# Patient Record
Sex: Male | Born: 1955 | Race: White | Hispanic: No | Marital: Married | Smoking: Never smoker
Health system: Southern US, Community
[De-identification: ages and names within clinical notes are randomized; demographics above are authoritative.]

## PROBLEM LIST (undated history)

## (undated) DIAGNOSIS — F32A Depression, unspecified: Secondary | ICD-10-CM

## (undated) DIAGNOSIS — F329 Major depressive disorder, single episode, unspecified: Secondary | ICD-10-CM

## (undated) DIAGNOSIS — N189 Chronic kidney disease, unspecified: Secondary | ICD-10-CM

## (undated) DIAGNOSIS — K219 Gastro-esophageal reflux disease without esophagitis: Secondary | ICD-10-CM

## (undated) DIAGNOSIS — Z9889 Other specified postprocedural states: Secondary | ICD-10-CM

## (undated) DIAGNOSIS — I341 Nonrheumatic mitral (valve) prolapse: Secondary | ICD-10-CM

## (undated) DIAGNOSIS — R112 Nausea with vomiting, unspecified: Secondary | ICD-10-CM

## (undated) DIAGNOSIS — G43909 Migraine, unspecified, not intractable, without status migrainosus: Secondary | ICD-10-CM

## (undated) DIAGNOSIS — K589 Irritable bowel syndrome without diarrhea: Secondary | ICD-10-CM

## (undated) DIAGNOSIS — E785 Hyperlipidemia, unspecified: Secondary | ICD-10-CM

## (undated) HISTORY — DX: Major depressive disorder, single episode, unspecified: F32.9

## (undated) HISTORY — DX: Irritable bowel syndrome, unspecified: K58.9

## (undated) HISTORY — DX: Migraine, unspecified, not intractable, without status migrainosus: G43.909

## (undated) HISTORY — PX: VASECTOMY: SHX75

## (undated) HISTORY — PX: ANAL FISSURE REPAIR: SHX2312

## (undated) HISTORY — DX: Hyperlipidemia, unspecified: E78.5

## (undated) HISTORY — DX: Nonrheumatic mitral (valve) prolapse: I34.1

## (undated) HISTORY — DX: Depression, unspecified: F32.A

## (undated) HISTORY — PX: SHOULDER SURGERY: SHX246

---

## 1998-03-01 ENCOUNTER — Ambulatory Visit: Admission: RE | Admit: 1998-03-01 | Discharge: 1998-03-01 | Payer: Self-pay | Admitting: Family Medicine

## 1999-05-20 ENCOUNTER — Ambulatory Visit (HOSPITAL_COMMUNITY): Admission: RE | Admit: 1999-05-20 | Discharge: 1999-05-20 | Payer: Self-pay | Admitting: Gastroenterology

## 2002-06-19 ENCOUNTER — Encounter: Payer: Self-pay | Admitting: General Surgery

## 2002-06-21 ENCOUNTER — Ambulatory Visit (HOSPITAL_COMMUNITY): Admission: RE | Admit: 2002-06-21 | Discharge: 2002-06-21 | Payer: Self-pay | Admitting: General Surgery

## 2002-07-14 ENCOUNTER — Emergency Department (HOSPITAL_COMMUNITY): Admission: EM | Admit: 2002-07-14 | Discharge: 2002-07-14 | Payer: Self-pay | Admitting: Emergency Medicine

## 2002-07-20 HISTORY — PX: OTHER SURGICAL HISTORY: SHX169

## 2004-07-20 HISTORY — PX: MITRAL VALVE REPAIR: SHX2039

## 2005-03-04 ENCOUNTER — Ambulatory Visit: Payer: Self-pay | Admitting: Cardiology

## 2005-03-18 ENCOUNTER — Ambulatory Visit: Payer: Self-pay

## 2005-03-27 ENCOUNTER — Ambulatory Visit: Payer: Self-pay

## 2005-03-31 ENCOUNTER — Ambulatory Visit: Payer: Self-pay | Admitting: Cardiology

## 2005-04-01 ENCOUNTER — Ambulatory Visit (HOSPITAL_COMMUNITY): Admission: RE | Admit: 2005-04-01 | Discharge: 2005-04-01 | Payer: Self-pay | Admitting: Cardiology

## 2005-04-01 ENCOUNTER — Encounter: Payer: Self-pay | Admitting: Cardiology

## 2005-04-03 ENCOUNTER — Inpatient Hospital Stay (HOSPITAL_BASED_OUTPATIENT_CLINIC_OR_DEPARTMENT_OTHER): Admission: RE | Admit: 2005-04-03 | Discharge: 2005-04-03 | Payer: Self-pay | Admitting: Internal Medicine

## 2005-04-13 ENCOUNTER — Ambulatory Visit
Admission: RE | Admit: 2005-04-13 | Discharge: 2005-04-13 | Payer: Self-pay | Admitting: Thoracic Surgery (Cardiothoracic Vascular Surgery)

## 2005-04-22 ENCOUNTER — Inpatient Hospital Stay (HOSPITAL_COMMUNITY)
Admission: RE | Admit: 2005-04-22 | Discharge: 2005-04-27 | Payer: Self-pay | Admitting: Thoracic Surgery (Cardiothoracic Vascular Surgery)

## 2005-04-22 ENCOUNTER — Encounter (INDEPENDENT_AMBULATORY_CARE_PROVIDER_SITE_OTHER): Payer: Self-pay | Admitting: Specialist

## 2005-04-30 ENCOUNTER — Ambulatory Visit: Payer: Self-pay | Admitting: Cardiology

## 2005-05-01 ENCOUNTER — Emergency Department (HOSPITAL_COMMUNITY): Admission: EM | Admit: 2005-05-01 | Discharge: 2005-05-01 | Payer: Self-pay | Admitting: Emergency Medicine

## 2005-05-07 ENCOUNTER — Ambulatory Visit: Payer: Self-pay | Admitting: Cardiology

## 2005-05-11 ENCOUNTER — Encounter
Admission: RE | Admit: 2005-05-11 | Discharge: 2005-05-11 | Payer: Self-pay | Admitting: Thoracic Surgery (Cardiothoracic Vascular Surgery)

## 2005-05-14 ENCOUNTER — Ambulatory Visit: Payer: Self-pay | Admitting: Cardiology

## 2005-05-21 ENCOUNTER — Ambulatory Visit: Payer: Self-pay | Admitting: Cardiology

## 2005-05-27 ENCOUNTER — Ambulatory Visit: Payer: Self-pay | Admitting: Cardiology

## 2005-06-01 ENCOUNTER — Ambulatory Visit: Payer: Self-pay | Admitting: Cardiology

## 2005-06-25 ENCOUNTER — Ambulatory Visit: Payer: Self-pay | Admitting: Internal Medicine

## 2005-07-06 ENCOUNTER — Ambulatory Visit: Payer: Self-pay | Admitting: Cardiology

## 2005-07-09 ENCOUNTER — Ambulatory Visit: Payer: Self-pay | Admitting: Cardiology

## 2005-12-21 ENCOUNTER — Ambulatory Visit: Payer: Self-pay | Admitting: Cardiology

## 2006-07-08 IMAGING — CR DG CHEST 2V
2 series · 2 of 2 positions shown · non-contrast
Comparison: Chest 04/25/05.

CLINICAL DATA: Right-sided chest pain.  Previous mitral valve repair 22 April, 2005.  
 CHEST - 2 VIEW:

[view not recorded (1 of 2)]
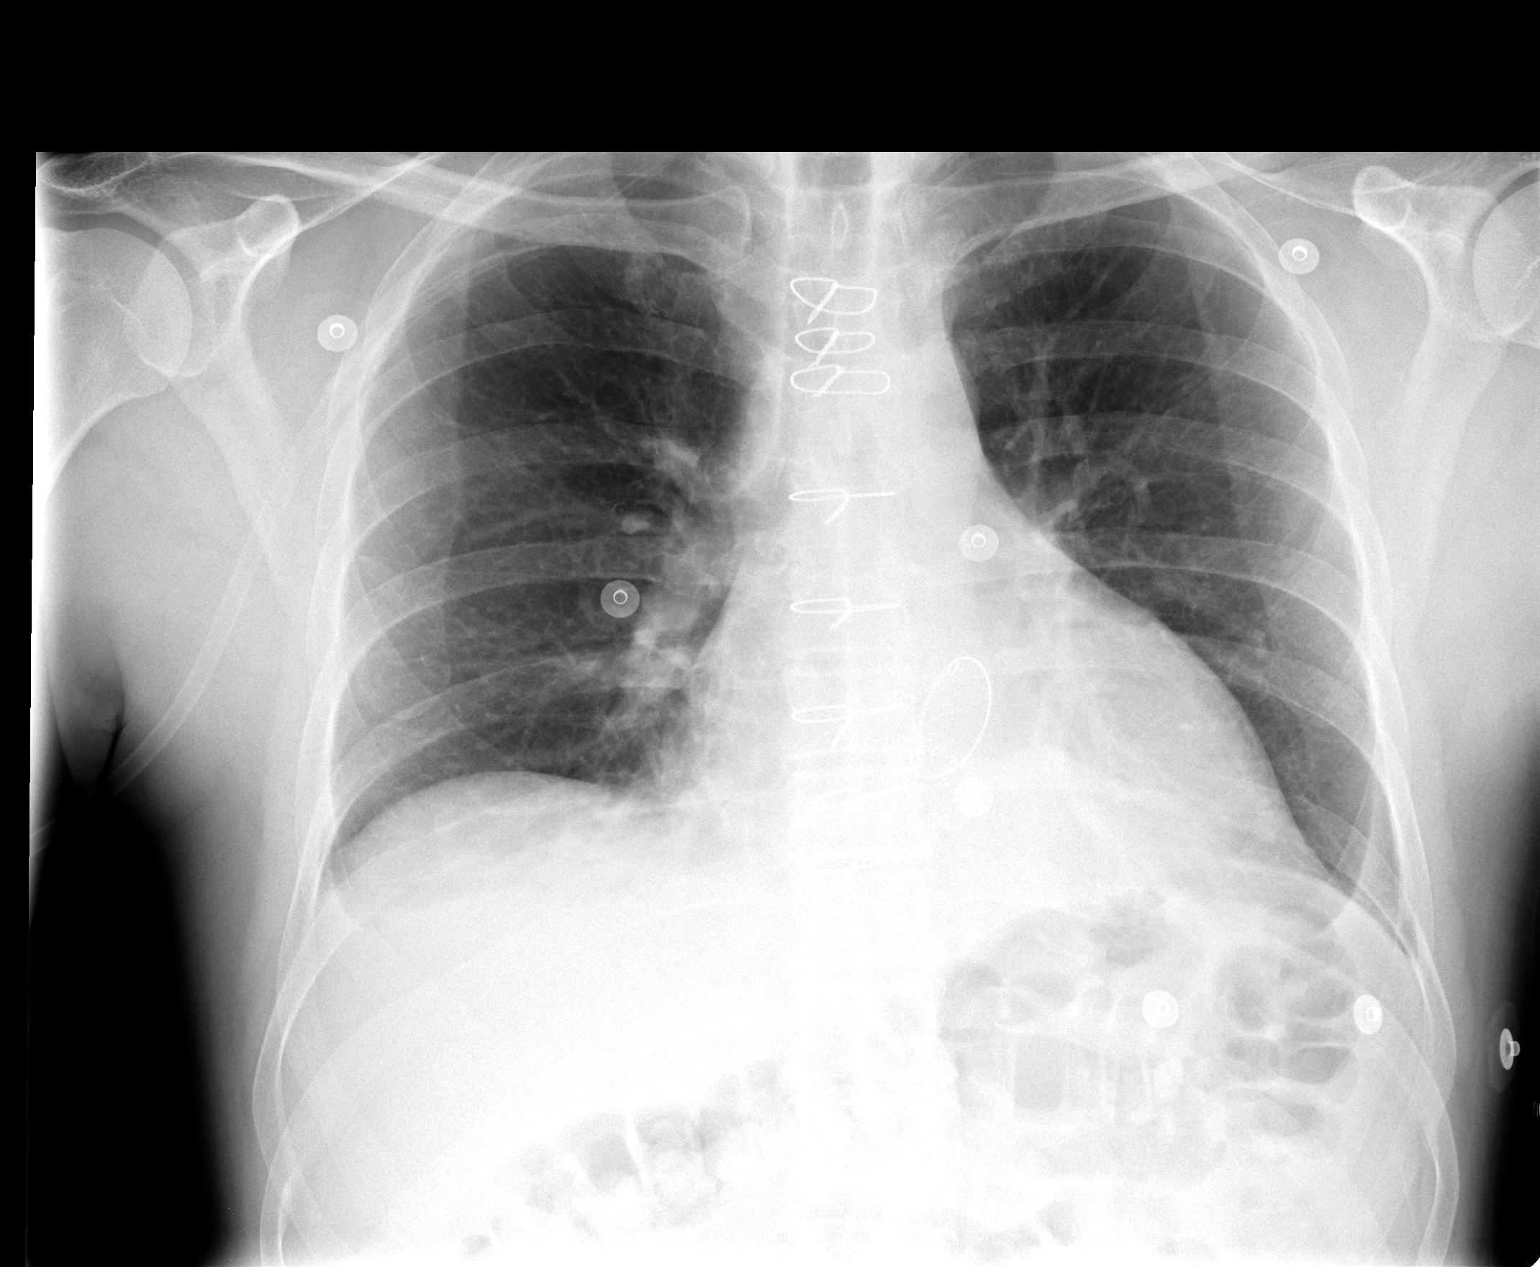

[view not recorded (2 of 2)]
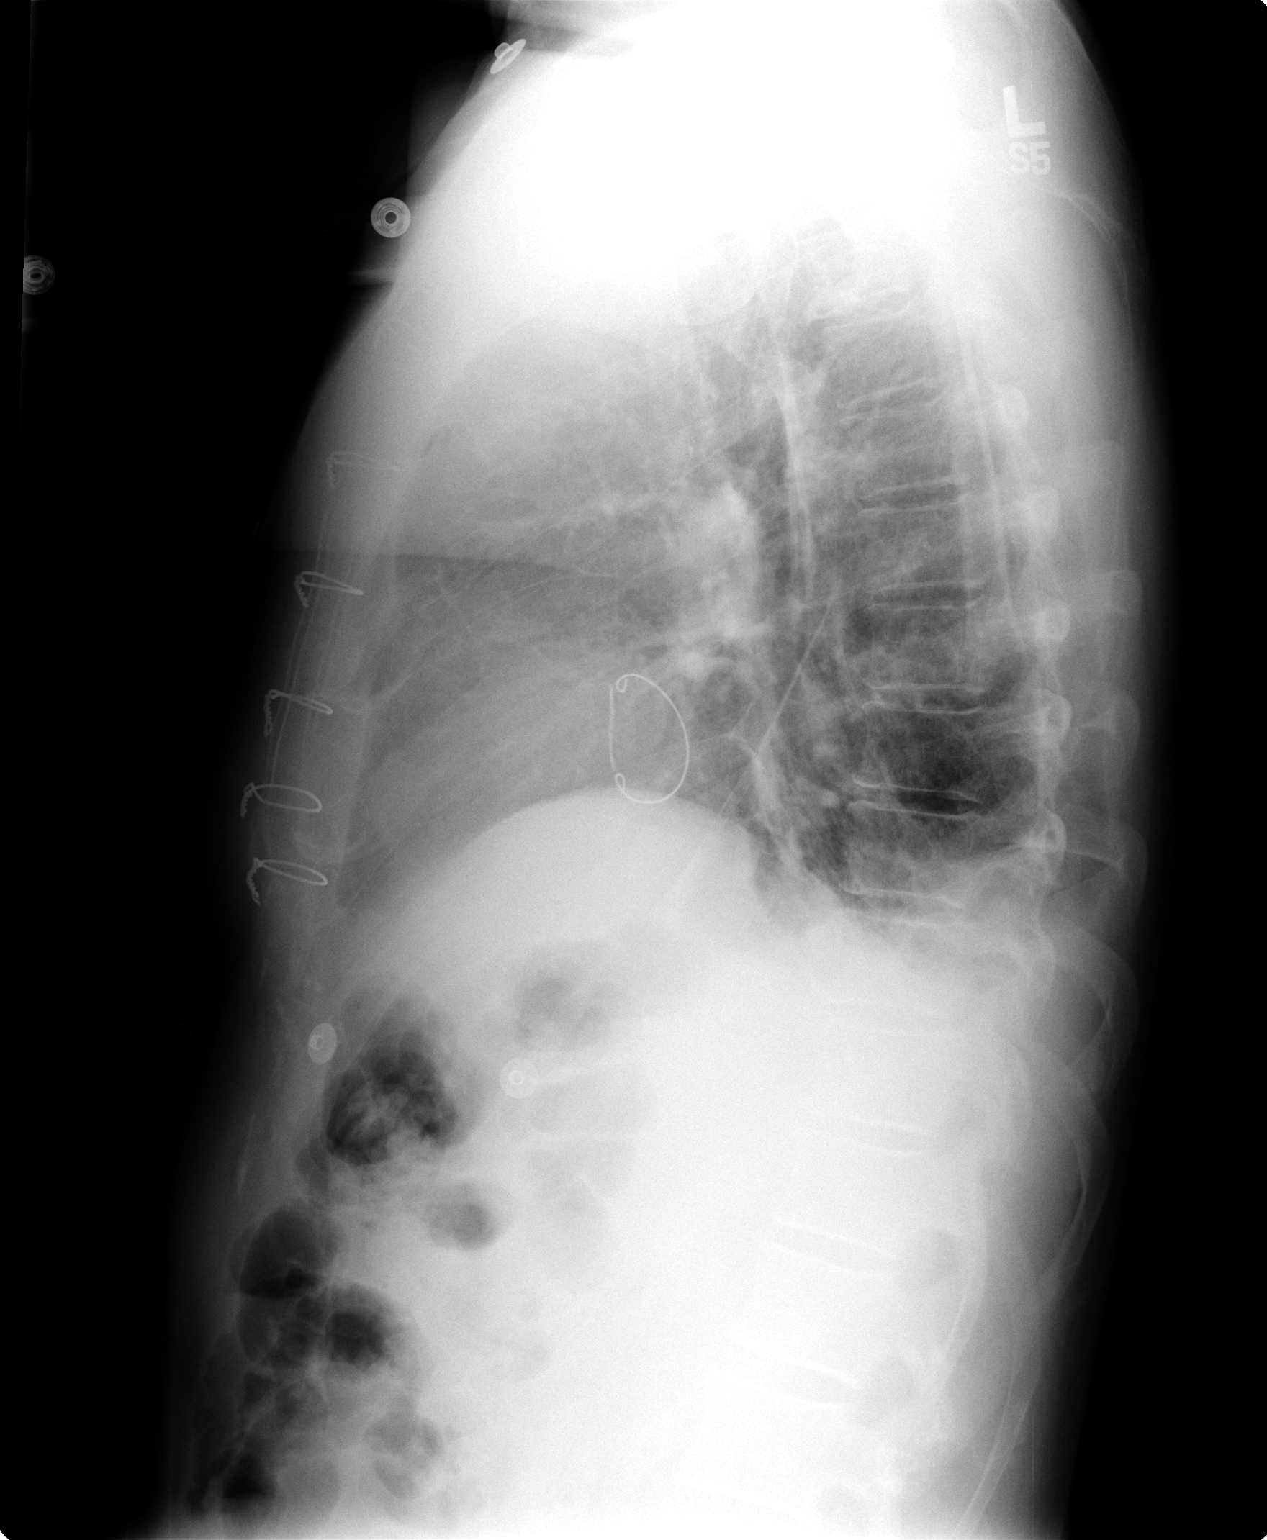

[2 of 2 positions shown; findings below may reference images not displayed]

FINDINGS: Borderline cardiomegaly.  Mitral valve prosthesis in apparent good position.  Mild atelectatic change in the retrocardiac region of the left lower lobe.  Small bilateral pleural effusions are present.  There are no infiltrates or failure.  I see no pneumothorax.  Overall there is marked improvement in aeration compared to the prior study.
IMPRESSION: Mild subsegmental atelectasis at the left base and small bilateral effusions.  Otherwise markedly improved aeration.

## 2006-07-18 IMAGING — CR DG CHEST 2V
2 series · 2 of 2 positions shown · non-contrast
Comparison: Two view chest x-ray 05/01/2005.

CLINICAL DATA: Mitral valve replacement 04/22/2005.

CHEST - 2 VIEW  05/11/2005:

[view not recorded (1 of 2)]
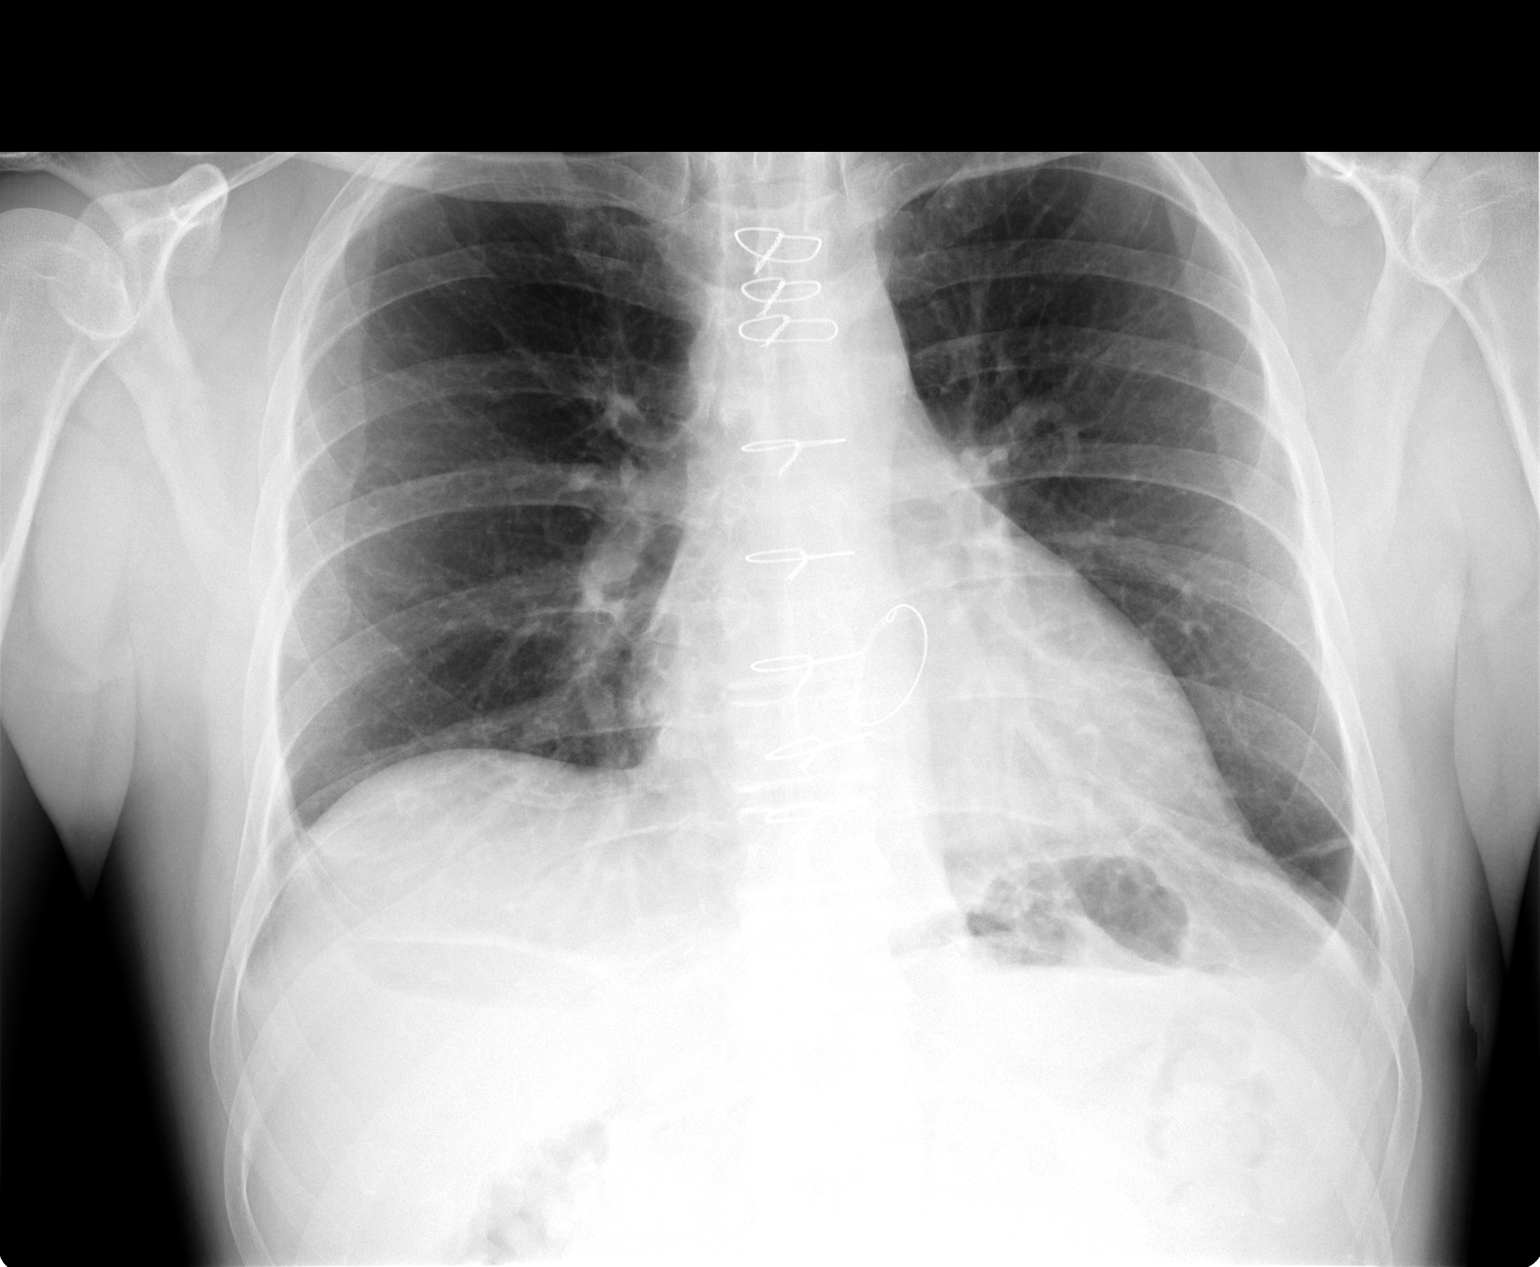

[view not recorded (2 of 2)]
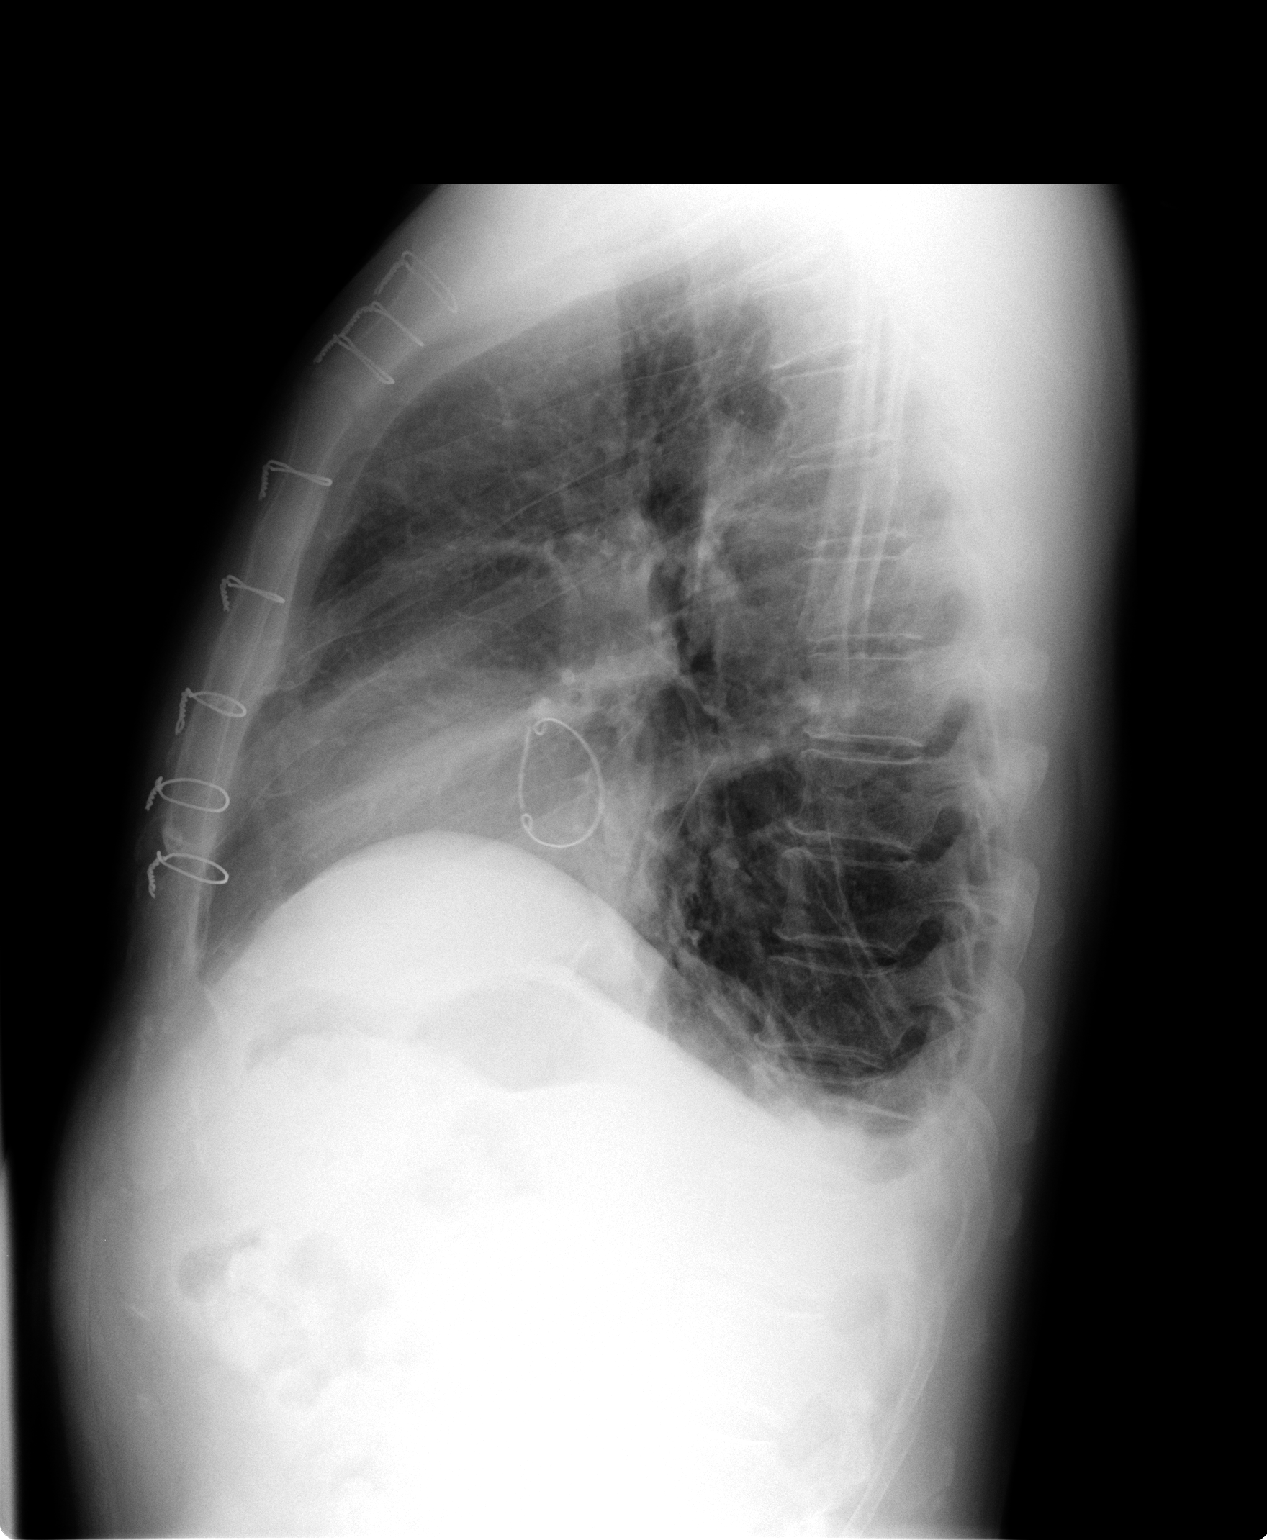

[2 of 2 positions shown; findings below may reference images not displayed]

FINDINGS: The heart is mildly enlarged but stable. The patient has undergone
mitral valve replacement. The bilateral pleural effusions have improved since
the previous examination, but do persist. There is associated mild atelectasis
in the lower lobes. The lungs are otherwise clear and there is no evidence of
pulmonary edema. Moderate degenerative changes are again noted in the thoracic
spine.
IMPRESSION: Improved but persistent small bilateral pleural effusions and associated
atelectasis in the lower lobes. No new abnormalities.

## 2006-07-20 HISTORY — PX: KNEE SURGERY: SHX244

## 2006-12-23 ENCOUNTER — Ambulatory Visit: Payer: Self-pay | Admitting: Cardiology

## 2007-05-19 ENCOUNTER — Encounter: Admission: RE | Admit: 2007-05-19 | Discharge: 2007-08-17 | Payer: Self-pay | Admitting: Orthopedic Surgery

## 2008-07-20 HISTORY — PX: INGUINAL HERNIA REPAIR: SUR1180

## 2008-09-26 ENCOUNTER — Ambulatory Visit: Payer: Self-pay | Admitting: Cardiology

## 2008-10-03 ENCOUNTER — Encounter: Payer: Self-pay | Admitting: Cardiology

## 2008-10-03 ENCOUNTER — Ambulatory Visit: Payer: Self-pay

## 2009-02-04 ENCOUNTER — Telehealth: Payer: Self-pay | Admitting: Cardiology

## 2009-07-26 ENCOUNTER — Encounter (INDEPENDENT_AMBULATORY_CARE_PROVIDER_SITE_OTHER): Payer: Self-pay | Admitting: *Deleted

## 2010-01-31 DIAGNOSIS — F329 Major depressive disorder, single episode, unspecified: Secondary | ICD-10-CM

## 2010-01-31 DIAGNOSIS — F3289 Other specified depressive episodes: Secondary | ICD-10-CM | POA: Insufficient documentation

## 2010-01-31 DIAGNOSIS — I059 Rheumatic mitral valve disease, unspecified: Secondary | ICD-10-CM | POA: Insufficient documentation

## 2010-02-04 ENCOUNTER — Ambulatory Visit: Payer: Self-pay | Admitting: Cardiology

## 2010-02-04 DIAGNOSIS — E785 Hyperlipidemia, unspecified: Secondary | ICD-10-CM | POA: Insufficient documentation

## 2010-08-19 NOTE — Letter (Signed)
Summary: Appointment - Reminder 2  [employer], Main Office  [address]   [place], [place] [address]   Phone: [phone]  Fax: [phone]     July 26, 2009 MRN: [id number]   [facility] [address] [place], [place]  [address]   Dear Craig Duke,  Our records indicate that it is time to schedule a follow-up appointment.Dr.Hochrein recommended that you follow up with [place] in March,2011. It is very important that we reach you to schedule this appointment. We look forward to participating in your health care needs. Please contact us at the number listed above at your earliest convenience to schedule your appointment.  If you are unable to make an appointment at this time, give [place] a call so we can update our records.     Sincerely,   Glass blower/designer

## 2010-08-19 NOTE — Assessment & Plan Note (Signed)
Summary: f1y/jml   Visit Type:  Follow-up Primary Provider:  [facility]  CC:  Mitral Valve Repair.  History of Present Illness: The patient returns for yearly followup. Since I last saw him he has done quite well. He is in exercises routinely as I would like though he did 6 mile walk the other day without any problems. He has had no shortness of breath, PND or orthopnea. He has had no chest pressure, neck or arm discomfort. He has had no palpitations, presyncope or syncope. He has had no weight gain or edema.  Current Medications (verified): 1)  Crestor 20 Mg Tabs (Rosuvastatin Calcium) .... Take One Tablet By Mouth Daily. 2)  Aspirin 81 Mg Tbec (Aspirin) .... Take One Tablet By Mouth Daily 3)  Citalopram Hydrobromide 10 Mg Tabs (Citalopram Hydrobromide) .... Take 1 Tablet By Mouth Once A Day  Allergies (verified): 1)  ! Penicillin 2)  ! * Oxycodone 3)  ! Hydrocodone  Past History:  Past Medical History: Reviewed history from 01/31/2010 and no changes required. Mitral valve repair secondary to prolapse with   severe regurgitation in April 22, 2005, by Dr. Cornelius Moras; dyslipidemia; ST   depression; anal sphincterectomy for anal fissure repair; vasectomy;   history of migraines; irritable bowel syndrome.      Past Surgical History:  1.  Anal sphincterotomy for anal fissure.  2.  Vasectomy  3.  Bilateral inguinal hernia repair  Review of Systems       As stated in the HPI and negative for all other systems.   Vital Signs:  Patient profile:   55 year old male Height:      67 inches Weight:      176.25 pounds BMI:     27.70 Pulse rate:   69 / minute Pulse rhythm:   regular Resp:     18 per minute BP sitting:   120 / 74  (right arm) Cuff size:   large  Vitals Entered By: Vikki Ports (February 04, 2010 9:49 AM)  Physical Exam  General:  Well developed, well nourished, in no acute distress. Head:  normocephalic and atraumatic Eyes:  PERRLA/EOM intact;  conjunctiva and lids normal. Mouth:  Teeth, gums and palate normal. Oral mucosa normal. Neck:  Neck supple, no JVD. No masses, thyromegaly or abnormal cervical nodes. Chest Wall:  well healed surgical scar Lungs:  Clear bilaterally to auscultation and percussion. Abdomen:  Bowel sounds positive; abdomen soft and non-tender without masses, organomegaly, or hernias noted. No hepatosplenomegaly. Msk:  Back normal, normal gait. Muscle strength and tone normal. Extremities:  No clubbing or cyanosis. Neurologic:  Alert and oriented x 3. Skin:  Intact without lesions or rashes. Cervical Nodes:  no significant adenopathy Inguinal Nodes:  no significant adenopathy Psych:  Normal affect.   Detailed Cardiovascular Exam  Neck    Carotids: Carotids full and equal bilaterally without bruits.      Neck Veins: Normal, no JVD.    Heart    Inspection: no deformities or lifts noted.      Palpation: normal PMI with no thrills palpable.      Auscultation: regular rate and rhythm, S1, S2 without murmurs, rubs, gallops, or clicks.    Vascular    Abdominal Aorta: no palpable masses, pulsations, or audible bruits.      Femoral Pulses: normal femoral pulses bilaterally.      Pedal Pulses: normal pedal pulses bilaterally.      Radial Pulses: normal radial pulses bilaterally.  Peripheral Circulation: no clubbing, cyanosis, or edema noted with normal capillary refill.     EKG  Procedure date:  02/04/2010  Findings:      Sinus rhythm, rate 69, leftward axis, no acute ST-T wave changes  Impression & Recommendations:  Problem # 1:  Mitral Valve Repair The patient has absolutely no murmur and no symptoms. I suspect a very stable repair. No imaging is indicated this year. I will bring him back in one year for followup and do an echo at that time as it will have been 6 years since the procedure and greater than 2 years since the prior echo.  Problem # 2:  DYSLIPIDEMIA (ICD-272.4) He says this is  followed by his primary provider. He reports an excellent lipid profile cholesterol. I will defer management to his primary team.  Other Orders: EKG w/ Interpretation (93000)

## 2010-12-02 NOTE — Assessment & Plan Note (Signed)
[facility]                            CARDIOLOGY OFFICE NOTE   Craig Duke, Craig Duke                       MRN:          [id number]  DATE:12/23/2006                            DOB:          1956/05/11    REASON FOR PRESENTATION:  Evaluate patient with mitral regurgitation,  status post repair.   HISTORY OF PRESENT ILLNESS:  Patient returns for 1-year followup.  He  has done quite well since I last saw him.  He is 55 years old.  He  denies any shortness of breath.  He is very active hunting and working.  He denies any PND, orthopnea, or chest discomfort.  He has had no  palpitations, presyncope, or syncope.   PAST MEDICAL HISTORY:  1. Mitral valve repair secondary to prolapse with severe regurgitation      April 22, 2005.  2. Dyslipidemia.  3. History of depression.  4. Anal sphincterotomy for anal fissure repair.  5. Vasectomy.  6. History of migraines.  7. Irritable bowel syndrome.   ALLERGIES:  PENICILLIN.   MEDICATIONS:  1. Aspirin 81 mg daily.  2. Lexapro 10 mg daily.   REVIEW OF SYSTEMS:  As stated in the HPI, negative for other systems.   PHYSICAL EXAMINATION:  Patient is in no distress.  Blood pressure 115/58.  Heart rate 81 and regular.  Weight 178 pounds.  Body mass index 27.  NECK:  No jugular venous distention at 45 degrees.  Carotid upstroke  brisk and symmetric.  No bruits.  No thyromegaly.  LYMPHATICS:  No lymphadenopathy.  LUNGS:  Clear to auscultation bilaterally.  BACK:  No costovertebral angle tenderness.  CHEST:  Unremarkable.  HEART:  PMI not displaced or sustained.  S1 and S2 within normal limits.  No S3.  No S4.  No clicks.  No rubs.  No murmurs.  ABDOMEN:  Flat.  Positive bowel sounds.  Normal in frequency and pitch.  No bruits.  No rebound.  No guarding.  No midline pulsatile mass.  No  hepatomegaly.  No splenomegaly.  SKIN:  No rashes.  No nodules.  EXTREMITIES:  Two plus pulses.  No edema.   EKG:  Sinus  rhythm, rate 81, axis within normal limits, intervals within  normal limits, no acute ST wave changes.   ASSESSMENT AND PLAN:  1. Mitral regurgitation.  The patient had excellent repair by Dr.      Cornelius Moras.  There is no obvious regurgitant residual murmur.  He is      having no symptoms.  He will be followed clinically.  We discussed      the latest endocarditis prophylaxis guidelines.  2. Followup.  We will him again in 18 months, or sooner if needed.     Rollene Rotunda, MD, [facility]  Electronically Signed    JH/MedQ  DD: 12/23/2006  DT: 12/23/2006  Job #: [id number]   cc:   [facility] [place]

## 2010-12-02 NOTE — Assessment & Plan Note (Signed)
[facility]                            CARDIOLOGY OFFICE NOTE   Craig, Duke                       MRN:          [id number]  DATE:09/26/2008                            DOB:          [birth year]    REASON FOR PRESENTATION:  Evaluate the patient with mitral regurgitation  status post valve repair.   HISTORY OF PRESENT ILLNESS:  The patient returns for followup.  It has  been about 20 months since I last saw him.  He has done well from a  cardiovascular standpoint.  He has had no cardiovascular symptoms.  He  denies any chest pain, neck, or arm discomfort.  He has had no  palpitations, presyncope, or syncope.  He has had no shortness of  breath.  He denies any PND or orthopnea.  He does not exercise, but he  works vigorously.  He has had some mild orthostatic symptoms  occasionally.   PAST MEDICAL HISTORY:  Mitral valve repair secondary to prolapse with  severe regurgitation in April 22, 2005, by Dr. Cornelius Moras; dyslipidemia; ST  depression; anal sphincterectomy for anal fissure repair; vasectomy;  history of migraines; irritable bowel syndrome.   ALLERGIES:  None.   MEDICATONS:  1. Aspirin 81 mg daily.  2. Citalopram 10 mg daily.  3. Crestor 20 mg daily.   REVIEW OF SYSTEMS:  Stated in the HPI and otherwise negative for all  other system.   PHYSICAL EXAMINATION:  GENERAL:  The patient is pleasant, in no  distress.  VITAL SIGNS:  Blood pressure is 107/68, heart rate 77 and regular.  No  orthostatic blood pressure changes.  HEENT:  Eyes were unremarkable.  Pupils equal, round, and reactive to  light; fundi not visualized, oral mucosa unremarkable.  NECK:  No jugular venous distension at 45 degrees, carotid upstroke  brisk and symmetrical, no bruits, no thyromegaly.  LYMPHATICS:  No cervical, axillary, or inguinal adenopathy.  LUNGS:  Clear to auscultation bilaterally.  BACK:  No costovertebral angle tenderness.  CHEST:  Unremarkable.  HEART:  PMI not displaced or sustained.  S1 and S2 within normal limits.  No S3, no S4.  No clicks, no rubs, no murmurs.  ABDOMEN:  Flat, positive bowel sounds, normal in frequency and pitch.  No bruits, no rebound, no guarding.  No midline pulsatile mass.  No  hepatomegaly, no splenomegaly.  SKIN:  No rashes, no nodules.  EXTREMITIES:  2+ pulses throughout, no edema.  No cyanosis, no clubbing.  NEURO:  Oriented to person, place, and time.  Cranial nerves II through  XII grossly intact.  Motor grossly intact.   EKG:  Sinus rhythm, rate 62, axis within normal limits, intervals within  normal limits, no acute ST-T wave changes.   ASSESSMENT AND PLAN:  1. Mitral valve regurgitation with repair.  This should be now 4 years      since his valve repair.  He has not had a followup echocardiogram.      One is due.  I will arrange this.  He understands endocarditis      prophylaxis.  Provided the echo looks  fine, no further      cardiovascular testing will be planned, but I will follow him      yearly with physical exams.  2. Orthostatic hypertension.  The patient describes the mild symptoms.      We discussed avoidance measures.  He sometimes does not drink much      fluid and he starts out with low blood pressure.  He knows to avoid      standing quickly and to stay hydrated.  3. Dyslipidemia.  Per his primary care doctor.  He was recently      switched to Crestor.  4. Followup.  I will see him back in 1 year or sooner if needed.     Rollene Rotunda, MD, [facility]  Electronically Signed    JH/MedQ  DD: 09/26/2008  DT: 09/27/2008  Job #: [id number]

## 2010-12-05 NOTE — Discharge Summary (Signed)
NAME:  Craig Duke, Craig Duke                ACCOUNT NO.:  [id number]   MEDICAL RECORD NO.:  [id number]          PATIENT TYPE:  INP   LOCATION:  2022                         FACILITY:  [facility]   PHYSICIAN:  Salvatore Decent. Cornelius Moras, M.D. DATE OF BIRTH:  [birth year]   DATE OF ADMISSION:  04/22/2005  DATE OF DISCHARGE:                                 DISCHARGE SUMMARY   DATE OF DISCHARGE:  April 27, 2005   ADMISSION DIAGNOSIS:  Mitral valve prolapse with severe mitral  regurgitation.   DISCHARGE DIAGNOSES:  1.  Mitral valve prolapse with severe mitral regurgitation, status post      repair.  2.  Hyperlipidemia.  3.  Depression.  4.  Anal sphincterotomy for anal fissure.  5.  Vasectomy.  6.  Allergic reaction to PENICILLIN as a child.  7.  History of migraine headaches.  8.  Remote history of reflux symptoms and irritable bowel syndrome.  9.  History of prostatitis approximately 10 years ago.   BRIEF HISTORY:  Craig Duke is a 55 year old Caucasian male with known history  of mitral valve prolapse with mitral regurgitation who was otherwise  healthy. He reported approximately 10 years ago he was noted to have a heart  murmur and underwent an echocardiogram in [place]. He has been taking  endocarditis prophylaxis over the years but remained otherwise without  symptoms. Recently, he was evaluated at [facility]  and was noted to have a prominent murmur on exam. He also had associated  exertional fatigue and shortness of breath that had seemed to progress over  the last years. He was referred to Dr. Rollene Rotunda who evaluated him on  March 04, 2005. Follow-up 2-D echocardiogram was performed on March 18, 2005 demonstrating mitral valve prolapse and severe mitral regurgitation.  Left ventricle was noted to be mildly dilated with an ejection fraction  estimated at 50-55%. 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 Left ventricular  function remained preserved and no other significant abnormalities were  appreciated.   He underwent elective left and right heart catheterization on April 03, 2005 which demonstrated a normal coronary artery anatomy with insignificant  coronary artery disease. There was mildly reduced left ventricular function  with an ejection fraction estimated at 45-50%. There was severe mitral  regurgitation with normal pulmonary artery pressures and normal cardiac  output. He was referred to heart surgeon, Dr. Salvatore Decent. Cornelius Moras, for elective  mitral valve repair.   After examining the patient and review the previous workup and findings, Dr.  Cornelius Moras did feel that mitral valve repair was the best treatment option.  Surgery was tentatively scheduled for April 15, 2005. In the meantime,  he was given a prescription for amiodarone 200 mg twice a day in an effort  to decrease the likelihood of perioperative arrhythmias.   PROCEDURE:  1.  Wednesday, April 22, 2005:  Mitral valve repair (quadrangular resection  of posterior leaflet, annuloplasty of the posterior commissure, 32      [employer] CG future ring annuloplasty) by Dr. Cornelius Moras April 22, 2005.  2.  April 22, 2005:  Intraoperative transesophageal echocardiogram by Dr.      Burna Forts. See dictated report.   HOSPITAL COURSE:  Alternatively, Craig Duke's surgery was rescheduled for  April 22, 2005 and he was taken to the operating room for mitral valve  repairs as we discussed. Craig Duke's postoperative course was rather  uneventful other than junctional rhythm in the immediate postoperative phase  requiring AAI pacing. On postoperative day three, his heart rate was  overriding the external pacer and the load stable at 70 to 80 beats per  minute. He was  maintained on amiodarone 200 mg twice daily postoperatively.  He was weaned off of the dopamine and Neo-Synephrine drips by postoperative  day one and was transferred out of the surgical intensive care unit. He  required short-term diuretic therapy for mild postoperative fluid volume  excess. There was no significant chest tube output postoperatively and chest  tubes were also removed by postoperative day one with follow-up chest x-ray  that was showing a small residual right apical pneumothorax and bibasilar  atelectasis.   Once out of the unit, Craig Duke made good progression. He was tolerating an  oral diet. He was voiding without difficulty. He did have some postoperative  constipation which was treated with stool softeners and laxatives. He was  able to mobilize in the hallways without great difficulty. He remained  hemodynamically stable with a systolic blood pressure around 100 and  diastolic around 60. He was afebrile. Again, he was maintaining sinus rhythm  without the use of external pacer by postoperative day three. He was  saturating above 92% on room air. His pain was controlled on oral  medications.   On exam, his heart had a regular rate and rhythm without murmur. Lungs were  clear. Abdominal exam was benign. Extremities were without edema and his  incisions were healing well without signs of infection.   Postoperative labs were also stable showing a sodium of 137, potassium 4.3,  chloride 101, CO2 30, blood glucose 100, BUN 14, creatinine 1.1, calcium  8.6. White blood count 9.4, hemoglobin 9.4, hematocrit 27.5, platelet count  133,000 which was increasing. Magnesium was normal at 2.4. At the time of  this dictation, his INR is still therapeutic at 1.2. It is anticipated that  he will need anticoagulation therapy for approximately three months  postoperatively for his mitral valve repair. Once his INR is therapeutic or near therapeutic, it is felt he will be appropriate  for discharge home,  hopefully by April 27, 2005 or April 28, 2005.   DISCHARGE MEDICATIONS:  1.  Aspirin 81 mg p.o. daily.  2.  Lipitor 10 mg p.o. daily.  3.  Amiodarone 200 mg p.o. twice daily.  4.  Lexapro 10 mg p.o. daily.  5.  Coumadin, final dose to be determined at the time of discharge.  6.  Ultram 50 mg 1-2 tablets p.o. q.4-6h. p.r.n. pain.   DISCHARGE INSTRUCTIONS:  He is instructed to avoid driving or heavy lifting  more than 10 pounds. He is to continue daily walking and breathing  exercises. He is to follow a low fat, low salt diet. He may shower and clean  his incisions gently with mild soap and water. He should notify the [facility]  office if he develops fever greater than 101, redness or  drainage from his  incision sites.   FOLLOW UP:  He is to have a PT/INR blood work within 48-72 hours at  discharge. He will be instructed on specific appointment at the time of  discharge. Coumadin therapy will be followed by Dr. Rollene Rotunda at  [facility]. He is to call and schedule a two week  follow-up with Dr. Rollene Rotunda with a chest x-ray. He is to follow up  with Dr. Salvatore Decent. Owen at the [facility] office in approximately three weeks.  He will be contacted with the specific appointment date and time and should  call sooner if needed.      Jerold Coombe, P.A.      Salvatore Decent. Cornelius Moras, M.D.  Electronically Signed    AWZ/MEDQ  D:  04/26/2005  T:  04/27/2005  Job:  [id number]   cc:   Salvatore Decent. Cornelius Moras, M.D.  [address]  [place]  [place] [address]   Ernestina Penna, M.D.  Fax: [phone]   Rollene Rotunda, M.D.  [phone]  [address]  [place]  [place] [address]

## 2010-12-05 NOTE — Op Note (Signed)
   NAME:  Craig Duke, Craig Duke                          ACCOUNT NO.:  [id number]   MEDICAL RECORD NO.:  [id number]                   PATIENT TYPE:  AMB   LOCATION:  DAY                                  FACILITY[facility]  [facility]   PHYSICIAN:  Ollen Gross. Vernell Morgans, M.D.              DATE OF BIRTH:  05/06/1956   DATE OF PROCEDURE:  06/21/2002  DATE OF DISCHARGE:  06/21/2002                                 OPERATIVE REPORT   PREOPERATIVE DIAGNOSES:  Anal fissure.   POSTOPERATIVE DIAGNOSES:  Anal fissure.   PROCEDURE:  Rigid sigmoidoscopy and examination under anesthesia and a left  lateral internal sphincterotomy.   SURGEON:  Ollen Gross. Carolynne Edouard, M.D.   ANESTHESIA:  General via LMA.   DESCRIPTION OF PROCEDURE:  After informed consent was obtained, the patient  was brought to the operating room, placed in a supine position in the  operating table. After adequate induction of general anesthesia, the patient  was placed in lithotomy position. The patient's perineum was then prepped  with Betadine and draped in the usual sterile manner. Prior to prepping and  draping, a rigid sigmoidoscopy was performed to about 20 cm and no obvious  abnormalities were identified except for the fissure. Once the patient was  prepped and draped, a bullet type retractor was placed within the rectum  gently so as not to create any other tears and the rectum was examined  circumferentially. A small posterior tear was identified and this area was  fulgurated with the electrocautery. Next in the left lateral position, a  small incision was made and the internal sphincter muscles were able to be  palpated. They were visually identified and hooked with a hemostat and  divided sharply with a 15 blade knife. The incision overlying the sphincter  muscles were then closed with a figure-of-eight 2-0 Chromic stitch. The  rectum was examined circumferentially and found to be hemostatic. The  perirectal area and sphincters were then injected  with a 0.25% Marcaine with  epinephrine. The patient tolerated the procedure well. At the end of the  case, all sponge, needle and instrument counts were correct. Lidocaine jelly  and Gelfoam were then placed in the rectum and sterile dressings were  applied. The patient was then awakened and taken to the recovery room in  stable condition.                                               Ollen Gross. Vernell Morgans, M.D.    PST/MEDQ  D:  07/06/2002  T:  07/06/2002  Job:  [id number]

## 2010-12-05 NOTE — Cardiovascular Report (Signed)
NAME:  DARROLD, BEZEK                ACCOUNT NO.:  [id number]   MEDICAL RECORD NO.:  [id number]          PATIENT TYPE:  OIB   LOCATION:  1962                         FACILITY:  [facility]   PHYSICIAN:  Arvilla Meres, M.D. LHCDATE OF BIRTH:  August 23, 1955   DATE OF PROCEDURE:  04/03/2005  DATE OF DISCHARGE:                              CARDIAC CATHETERIZATION   PRIMARY CARE PHYSICIAN:  Montey Hora, PA, at [facility].   CARDIOLOGIST:  Dr. Angelina Sheriff.   PATIENT IDENTIFICATION:  [name] is a very pleasant 55 year old male with  mitral valve prolapse and severe mitral regurgitation who is referred for  right and left heart catheterization prior to possible mitral valve repair.   PROCEDURES PERFORMED:  1.  Selective coronary angiography.  2.  Left heart catheterization.  3.  Left ventriculogram.  4.  Right heart catheterization with Fick cardiac outputs.   DESCRIPTION OF PROCEDURE:  The risks and benefits of the procedure were  explained to Mr. Althouse; consent was signed and placed in the chart.  A 7-  French venous sheath was placed in right femoral vein.  A 4-French arterial  sheath was placed in the right femoral artery.  Standard catheters including  JL4, JR4 and an angled pigtail were used for the procedure.  In attempting  to put the pigtail into the left ventricle, the catheter kinked and had to  be withdrawn over the wire.  Unfortunately, during the process, the sheath  actually became dislodged as well, thus all the apparatuses were removed and  manual pressure was held for about 10-15 minutes with good hemostasis.  The  right femoral artery was then recannulated above of the previous stick and a  second femoral artery sheath was placed and the procedure completed.  All  catheter exchanges were made over the wire.  There were no other apparent  complications.   FINDINGS:  Right atrial pressure with a mean of 6, RV 27/40.  PA pressure  27/11 with a  mean of 18.  Pulmonary capillary wedge pressure had a mean of  13 with a V wave of 24.  Fick cardiac output was 5.0 L/min.  Fick cardiac  affects was 2.6 L/min per sq m.  Thermodilution cardiac output was 5.0 L/min  and thermodilution cardiac index was 2.6 L/min per sq m.  Pulmonary vascular  resistance was 1.0 Woods units.  Central aortic pressure was 108/69 with a  mean of 86.  LV pressure is 113/9 with an EDP of 16.  There was no gradient  on aortic valve pullback.   The left main was short and angiographically normal.   LAD:  Was a moderate-sized vessel coursing to the apex  It gave off 1 large  diagonal.  It was angiographically normal.   Left circumflex was a very large dominant system.  It gave off a moderate-  sized OM-1, a large OM-2, two small -to-moderate PLs and small-to-moderate-  sized PDA.  There is no angiographic CAD.   The right coronary artery was small and nondominant with no angiographic  CAD.   Left ventriculogram  done the RAO approach showed an EF of 45% to 50% with  mild global hypokinesis.  There appeared to be mitral valve prolapse and 2+  mitral regurgitation, though this was difficult to ascertain clearly.   ASSESSMENT:  1.  Normal coronary arteries without significant coronary artery disease.  2.  Mildly reduced left ventricular ejection fraction with ejection fraction      of 45% to 50%.  3.  Normal filling pressures with increased V wave consistent with known      severe mitral regurgitation.  4.  Severe mitral regurgitation by transesophageal echocardiography      secondary to mitral valve prolapse.   The plan will be a referral to Dr. Ashley Mariner and CVTS for possible mitral  valve repair versus replacement.      Arvilla Meres, M.D. [facility]  Electronically Signed     DB/MEDQ  D:  04/03/2005  T:  04/03/2005  Job:  [id number]   cc:   [facility] [place], [place]   Rollene Rotunda, M.D.  [phone] [address]  [address]   [place]  [place] [address]

## 2010-12-05 NOTE — Op Note (Signed)
NAME:  Craig Duke, Craig Duke                ACCOUNT NO.:  [id number]   MEDICAL RECORD NO.:  [id number]          PATIENT TYPE:  INP   LOCATION:  2306                         FACILITY:  [facility]   PHYSICIAN:  Burna Forts, M.D.DATE OF BIRTH:  [birth year]   DATE OF PROCEDURE:  04/22/2005  DATE OF DISCHARGE:                                 OPERATIVE REPORT   PROCEDURE PERFORMED:  Intraoperative transesophageal echocardiogram.   INDICATIONS FOR PROCEDURE:  Craig Duke is a 55 year old gentleman patient of  Dr. Tressie Stalker, who presents today for mitral valve replacement or repair  to be performed by Dr. Cornelius Moras.  He is known to have severe mitral  regurgitation and likely prolapse of the leaflets of the mitral valve.   DESCRIPTION OF PROCEDURE:  He is brought to the holding area on the morning  of surgery where under local anesthesia and sedation, pulmonary catheter and  radial arterial lines were inserted.  He was taken to the operating room for  routine induction of general anesthesia after which the TEE probe was  prepared and inserted orally.  It was somewhat withdrawn for imaging of the  cardiac structures.   Precardiopulmonary bypass transesophageal echocardiogram examination:   Left ventricle.  The left ventricular chamber is a dilated left ventricular  chamber seen in a 4 chamber view in the long axis view.  Interestingly, the  contractile pattern appears slightly depressed from normal.  There is normal  wall thickness and decreased overall contractility noted in a short axis  view as well as long axis views noted.  This would be a slight global  overall decrease in contractility with ejection fraction at or just below  40%.  Papillar muscles are well outlined within the chamber. There are no  masses noted within.   Mitral valve.  This is a clearly myxomatous degenerative mitral valve  apparatus with primary pathology being a large knuckle of a posterior  segment of the posterior  leaflet prolapsing well into the left atrium above  the level of the anterior leaflet.  This is associated with a fairly severe  jet of mitral regurgitant flow beneath the area of the prolapsing segment of  the posterior leaflet over the top of the anterior leaflet and spreading  along the interatrial septum well into the depth of the left atrial chamber.  The anterior leaflet as well is degenerative and mildly prolapsing but the  primary pathology is likely a segment of the P2 perhaps P2 segment that  prolapses severely into the left atrial chamber associated with regurgitant  flow.   Left atrium.  This is a dilated left atrial chamber seen in the four chamber  view.  The portion of the jet of regurgitant flow was seen compressed along  the interatrial septum during systolic contractions.  The left atrial  appendage is visualized.  There are no masses noted within.  The pulmonary  veins were not well visualized.  We could not demonstrate reversal of flow  but this is not unusual with that type of regurgitant flow that is primarily  along the interatrial septum.  The interatrial septum is interrogated and  appears intact with no flow across that membrane.   Aortic valve.  Normal three cusp aortic valve totally normal in its  function.   Right ventricle.  Mildly increased size in the right ventricular chamber.  Good overall contractility is noted.   Tricuspid valve thin, compliant, mobile apparatus.  There is a trace amount  of regurgitant flow noted.   Right atrium.  This is essentially normal chamber.  Pulmonary artery  catheter noted within.   The patient was placed on cardiopulmonary bypass and hypothermia was begun.  The repair was that of a mitral ring annuloplasty performed by Dr. Cornelius Moras.  Deairing maneuvers were carried out and the patient was separated from  cardiopulmonary bypass with the initial attempt.   Post cardiopulmonary bypass transesophageal echocardiogram  examination  (limited exam):  Left ventricle.  Early in the bypass period there is some mild depression  with the left ventricular chamber function though the chamber is paced at  this time and inotropes (i.e. dopamine) is begun.  Over a period of time and  with separation from cardiopulmonary bypass the contractile pattern seen in  both short and long axis views is improved.  There still remains some mild  degrees of septal hypocontractility, but overall the left ventricular  chamber is improved in the post bypass period.   Mitral valve.  Replacement of diseased mitral valve could now be seen.  The  ring annuloplasty repair ring suture outlines are seen well seated in the  mitral annular area.  The major portion of the anterior leaflet is well  interrogated.  It collapsed below the level of the ring itself.  The  posterior leaflet is not well visualized though in these ring repairs, that  is often the case.  Doppler examination which is of primary importance,  indicates there is simply no mitral regurgitant flow.  Multiple views are  obtained to reveal again no mitral regurgitant flow.  This is considered a  satisfactory repair.  The rest of the cardiac examination was as previously  described.  The patient was returned to the cardiac intensive care unit in  stable condition.           ______________________________  Burna Forts, M.D.     JTM/MEDQ  D:  04/22/2005  T:  04/22/2005  Job:  [id number]

## 2010-12-05 NOTE — Op Note (Signed)
NAME:  Craig Duke, Craig Duke                ACCOUNT NO.:  [id number]   MEDICAL RECORD NO.:  [id number]          PATIENT TYPE:  INP   LOCATION:  2306                         FACILITY:  [facility]   PHYSICIAN:  Salvatore Decent. Cornelius Moras, M.D. DATE OF BIRTH:  Jul 18, 1956   DATE OF PROCEDURE:  04/22/2005  DATE OF DISCHARGE:                                 OPERATIVE REPORT   PREOPERATIVE DIAGNOSIS:  Mitral valve prolapse with severe mitral  regurgitation.   POSTOPERATIVE DIAGNOSIS:  Mitral valve prolapse with severe mitral  regurgitation.   PROCEDURE:  Mitral valve repair (quadrangular resection of posterior  leaflet, commissuroplasty of the posterior commissure, 32 mm Medtronic CG  Future Ring annuloplasty).   SURGEON:  Salvatore Decent. Cornelius Moras, M.D.   ASSISTANT:  Kerin Perna, M.D.   SECOND ASSISTANT:  Rowe Clack, P.A.-C.   ANESTHESIA:  General.   BRIEF CLINICAL NOTE:  The patient is a 55 year old gentleman with known  history of mitral valve prolapse, who now presents with worsening symptoms  of exertional shortness of breath and fatigue.  His echocardiogram confirms  mitral valve prolapse with severe mitral regurgitation and now has signs of  dilatation of the left ventricular chamber and mild left ventricular  dysfunction.  Cardiac catheterization demonstrates normal coronary artery  anatomy without any significant coronary artery disease.  A full  consultation note has been dictated previously.   OPERATIVE CONSENT:  The patient has been counseled at length regarding the  indications and potential benefits of mitral valve repair.  Alternative  strategies have been discussed.  He understands and accepts all associated  risks of surgery including but not limited to risks of death, stroke,  myocardial infarction, congestive heart failure, respiratory failure,  pneumonia, bleeding requiring blood transfusion, arrhythmia, heart block or  bradycardia requiring permanent pacemaker, possible need for  valve  replacement, possibility of late recurrence of mitral regurgitation or  mitral stenosis.  All of his questions have been addressed.   OPERATIVE NOTE IN DETAIL:  The patient is brought to the operating room on  the above-mentioned date and central monitoring is established by the  anesthesia service under the care and direction of Dr. Sharee Holster.  Specifically, a Swan-Ganz catheter is placed through the right internal  jugular approach.  A radial arterial line is placed.  Intravenous  antibiotics are administered.  Following induction with general endotracheal  anesthesia, a Foley catheter is placed.  The patient's chest, abdomen, both  groins, and both lower extremities are prepared and draped in a sterile  manner.   A median sternotomy incision is performed.  The pericardium is opened.  The  ascending aorta is normal in appearance.  There is biventricular enlargement  of the heart.  The patient is heparinized systemically.  The ascending aorta  is cannulated.  A venous cannula is placed directly in the superior vena  cava.  A second venous cannula is placed low in the right atrium with tip  extending down the inferior vena cava.  A retrograde cardioplegia catheter  is placed through the right atrium into the coronary sinus.   Adequate  heparinization is verified.  Cardiopulmonary bypass is begun.  Vessel loops are placed around the superior vena cava and the inferior vena  cava.  The cardioplegia catheter is placed in the ascending aorta.  A  temperature probe is placed in the left ventricular septum.   The patient is cooled to 32 degrees systemic temperature.  The aortic  crossclamp is applied and cold blood cardioplegia is delivered initially in  an antegrade fashion through the aortic root.  Iced saline slush is applied  for topical hypothermia.  Supplemental cardioplegia is administered  retrograde through the coronary sinus catheter.  The initial cardioplegic  arrest  and myocardial cooling are felt to be excellent.  Repeat doses of  cardioplegia are administered intermittently throughout the crossclamp  portion of the operation retrograde to the coronary sinus catheter to  maintain left ventricular septal temperature below 15 degrees centigrade.   A left atriotomy incision is performed posteriorly through the interatrial  groove.  The mitral valve is exposed using a self-retaining retractor.  Exposure is felt to be excellent.  The mitral valve is carefully evaluated.  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  There is a huge area of prolapse involving the P2 portion of the  posterior leaflet.  There is some mild prolapse of P1 and P3 and mild  prolapse of the entire anterior leaflet.  There is some more eccentric  prolapse at the posterior commissure.  However, the primary problem related  to competence of the valve is clearly the large area of severe prolapse  involving the middle scallop of the posterior leaflet.  There are a few  ruptured secondary chords to the middle portion of the posterior leaflet,  but the remainder of the chords are all intact although somewhat elongated.   Quadrangular resection of a large portion of the P2 segment of the posterior  leaflet which is prolapsed is performed.  A sliding leaflet annuloplasty is  then performed as both the P1 and P3 portions of the posterior leaflet are  mobilized off the posterior annulus.  Several interrupted figure-of-eight 2-  0 Ethibond compression sutures are utilized to shorten the posterior annular  circumference.  The P1 and P3 portions of the posterior leaflet are then  reattached to the posterior annulus using a two-layer closure of running 4-0  Ethibond suture.  The remaining vertical defect in the posterior leaflet between the P1 and P3 portions are closed using  interrupted everting 4-0  Ethibond sutures.  The valve is again reanalyzed.  There remains some mild  prolapse at the posterior commissure as well as some mild prolapse of the P1  portion of the posterior leaflet.  The prolapse of the P1 portion is  corrected by closing the cleft between P1 and P2 to pull the free edge of  the P1 leaflet down to the plane of the annulus.  The mild prolapse of the  posterior commissure is closed using a commissuroplasty with interrupted 4-0  Ethibond sutures.  Ring annuloplasty is now performed using interrupted 2-0  Ethibond horizontal mattress sutures placed circumferentially around the  entire mitral valve annulus.  A Medtronic CG Future Ring annuloplasty ring  is chosen.  The mitral valve is sized to accept a 32 mm annuloplasty ring.  This size is slightly smaller than the intertrigonal distance but  corresponds perfectly to the size and surface area of the anterior leaflet  of the mitral valve.  The Medtronic annuloplasty ring (serial number  [id number]) is secured in place uneventfully.  After completion of the  mitral valve repair, the valve is carefully inspected by instilling iced  saline into the left ventricular chamber.  There appears to be a nice,  symmetrical line of coaptation of the anterior and posterior leaflet and no  significant residual mitral regurgitation appreciated at all.   Rewarming is begun.  The left atrial appendage is oversewn from within the  left atrium using a two-layer closure of running 3-0 Prolene suture.  The  left atriotomy incision is now closed using a two-layer closure of running 3-  0 Prolene suture.  The patient is placed in Trendelenburg position.  One  final dose of warm retrograde hot-shot cardioplegia is administered.  The  lungs are ventilated and the heart allowed to fill to evacuate any residual  air.  The aortic crossclamp is removed after a total crossclamp time of 125  minutes.   The heart begins to  beat spontaneously without need for cardioversion.  Normal sinus rhythm resumes.  The retrograde cardioplegia catheter is removed.  The vessel loops around  the venae cavae are both removed.  The patient rewarmed to 37 degrees  centigrade temperature.  Epicardial pacing wires are fixed to the right  ventricular outflow tract and to the right atrial appendage.  The IVC  cannula is removed and its cannulation site oversewn with Prolene suture.  Transesophageal echocardiogram is utilized to verify that there is no  significant residual air in the left atrium or left ventricle.  The aortic  root vent is then removed.   The patient is weaned from cardiopulmonary bypass without difficulty.  The  patient's rhythm at separation from bypass is normal sinus rhythm.  Atrial  pacing is employed to increase the heart rate.  Total cardiopulmonary bypass time for the operation 145 minutes.  The  patient is weaned from bypass on dopamine at 3 mcg/kg per minute.   Follow-up transesophageal echocardiogram performed by Dr. Jacklynn Bue after  separation from bypass demonstrates preserved left ventricular function with  a well-seated mitral annuloplasty ring and a perfect-appearing mitral valve  repair with no residual mitral regurgitation and no sign of any systolic  anterior motion of the mitral valve.  There is no residual air.  No other  abnormalities are noted.   The SVC cannula is removed.  The aortic cannula is removed uneventfully.  Protamine is administered to reverse the anticoagulation.  The mediastinum  is irrigated with saline solution containing vancomycin.  Meticulous surgical hemostasis is ascertained.  The mediastinum is drained  using two chest tubes and a right pleural tube is also placed.  These are  exited through separate stab incisions inferiorly.  The median sternotomy is  closed with single-strength sternal wire after reapproximating the  pericardium loosely over the ascending aorta.   The soft tissues anterior to  the sternum are closed in multiple layers and the skin is closed with a  running subcuticular skin closure.   The patient tolerates the procedure well and is transported to the surgical  intensive care unit in stable condition.  There are no intraoperative  complications.  All sponge, instrument and needle counts are verified  correct at completion of the operation.  No blood products are administered.      Salvatore Decent. Cornelius Moras, M.D.  Electronically Signed     CHO/MEDQ  D:  04/22/2005  T:  04/22/2005  Job:  [id number]  cc:   Rollene Rotunda, M.D.  [address]  [address]  [place]  [place] [address]   Ernestina Penna, M.D.  Fax: [phone]

## 2010-12-05 NOTE — H&P (Signed)
NAME:  Duke, Craig                ACCOUNT NO.:  [id number]   MEDICAL RECORD NO.:  [id number]          PATIENT TYPE:  INP   LOCATION:                               FACILITY:  [facility]   PHYSICIAN:  Salvatore Decent. Cornelius Moras, M.D. DATE OF BIRTH:  1956-01-02   DATE OF ADMISSION:  04/15/2005  DATE OF DISCHARGE:                                HISTORY & PHYSICAL   ADMISSION HISTORY AND PHYSICAL EXAM AND CONSULTATION REPORT:   CHIEF COMPLAINT:  Leaky mitral valve.   HISTORY OF PRESENT ILLNESS:  Craig Duke is a 55 year old gentleman with known  history of mitral valve prolapse and mitral regurgitation who has otherwise  been healthy. He reports that more than 10 years ago he was noted to have a  heart murmur, and he underwent an echocardiogram in [place] at that  time. He has been taking endocarditis prophylaxis over the years, but  remained otherwise without symptoms. Recently he was evaluated at [facility] and noted to have a prominent murmur on exam. The  patient also admits to exertional fatigue and shortness of breath that seems  to have progressed over the last few years. He was referred to Dr. Rollene Rotunda who evaluated him on August16. A followup 2-D echocardiogram was  performed on August30 demonstrating mitral valve prolapse with severe mitral  regurgitation. The left ventricle was noted to be mildly dilated with  ejection fraction estimated 50-55%. He subsequently underwent a followup  transesophageal echocardiogram on September13. 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  Left ventricular function remained preserved, and no other significant  abnormalities were appreciated. Mr. Mckone then underwent elective left and  right heart catheterization on September15. This demonstrated normal  coronary artery anatomy with  insignificant coronary artery disease. There is  mildly reduced left ventricular function with ejection fraction estimated 45-  50%. There is severe mitral regurgitation with normal pulmonary artery  pressures and normal cardiac output. Mr. Luckman has been referred to consider  elective mitral valve repair.   REVIEW OF SYSTEMS:  GENERAL: The patient reports overall feeling well  otherwise. He has good appetite and has not been gaining or losing weight  recently. CARDIAC: The patient denies any history of chest pain, chest  tightness or chest pressure either with activity or at rest. He does admit  to progressive exertional fatigue and shortness of breath. This has been  subtle but has nonetheless progressed over the last few years. He denies any  resting shortness of breath, PND, orthopnea, or lower extremity edema. He  has not had any syncopal episodes. He does report that approximately 2  months ago he had a single transient episode of what sounded like tachy  palpitations associated with dizziness. RESPIRATORY: Negative. The patient  denies persistent or productive cough, hemoptysis, wheezing.  GASTROINTESTINAL: Notable that the patient has intermittent loose bowel  movements and diarrhea attributed to irritable bowel syndrome. This has been  stable and not required medical therapy. The patient  denies hematochezia,  hematemesis, melena. He has not had any difficulty swallowing.  GENITOURINARY: Negative. PERIPHERAL VASCULAR: Negative. NEUROLOGIC:  Negative. MUSCULOSKELETAL: Negative. PSYCHIATRIC: Notable for mild chronic  depression which has been improved recently on medical treatment. HEENT:  Negative. The patient has good dentition and sees his dentist twice yearly  on a regular basis and gets routine dental prophylaxis with each visit.   PAST MEDICAL HISTORY:  1.  Mitral valve prolapse with mitral regurgitation  2.  Hyperlipidemia  3.  Depression.   PAST SURGICAL HISTORY:  1.   Anal sphincterotomy for anal fissure.  2.  Vasectomy.   FAMILY HISTORY:  Noncontributory.   SOCIAL HISTORY:  The patient is married and lives [place] his wife [place]  [place] in [place]. 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 He is a  nonsmoker, denies alcohol consumption. He has two grown children and  numerous grandchildren.   CURRENT MEDICATIONS:  1.  Lipitor 10 mg daily  2.  Lexapro 10 mg daily.   DRUG ALLERGIES:  None definitely known, although the patient had a reaction  it during childhood that may have represented a PENICILLIN allergy.   PHYSICAL EXAMINATION:  GENERAL: The patient is a well-appearing white male  who appears his stated age in no acute distress.  VITAL SIGNS: Blood pressure is 140/80, pulse of 68 and regular, oxygen  saturation is 98% on room air. He is afebrile.  HEENT:  Exam is essentially within normal limits.  NECK: The neck is supple. There is no cervical or supraclavicular  lymphadenopathy. There is no jugular venous distension. No carotid bruits  are noted.  CHEST: Auscultation of the chest reveals clear and symmetrical breath sounds  bilaterally. No wheezes or rhonchi are appreciated.  CARDIOVASCULAR:  Exam includes regular rate and rhythm. There is a prominent  grade 3-4/6 holosystolic murmur heard best at the apex with radiation across  the precordium into the axilla. No diastolic murmurs are noted.  ABDOMEN: The abdomen is soft and nontender. Liver edge is not palpable.  There is no ascites. Bowel sounds are present. There are no palpable masses.  EXTREMITIES:  Warm and well-perfused. There is no lower extremity edema.  There is no sign of significant venous insufficiency. Distal pulses are  palpable in both lower legs at the ankle.  RECTAL/GU: Exams both deferred.  NEUROLOGIC:  Examination is grossly nonfocal. SKIN: The skin is clean, dry, healthy appearing  throughout.   DIAGNOSTIC TESTS:  Transesophageal echocardiogram performed by Dr. Jens Som  on [facility] Knife is reviewed. This demonstrates what appears to be classical  myxomatous disease afflicting the mitral valve with severe prolapse of the  posterior leaflet and mild prolapse of the anterior leaflet and severe  mitral regurgitation consisting of an eccentric jet coursing around the  anterior wall of the left atrium. The left ventricle does appear somewhat  dilated, although left ventricular systolic function appears fairly well  preserved. I would estimate ejection fraction at 50%. The aortic valve  appears normal, and there is no significant aortic insufficiency. There does  not appear to be left atrial thrombus nor does there appear to be  interatrial communication. No other significant abnormalities were noted.   Cardiac catheterization performed Eldridge Scot is also reviewed and  demonstrates left-dominant coronary circulation with insignificant coronary  artery disease. Left ventricular function does appear reduced with ejection  fraction 45% despite significant mitral regurgitation. PA pressures remained  normal; specifically, PA pressures were measured 27/11 with a pulmonary  capillary wedge pressure of 13 and a V-wave of 24. Cardiac output was 5  liters per minute using the Fick method and 5 liters per minute using  thermodilution. Central venous pressure was 6.   IMPRESSION:  Mitral valve prolapse with severe mitral regurgitation and  early signs of developing left ventricular dysfunction. Mr. Prindle remains  relatively free of any symptoms, although he does admit to progressive  exertional fatigue and some exertional shortness of breath. I believe he  would best be treated by elective mitral valve repair.   PLAN:  I have discussed options at length with Mr. Bressi here in the office  today. Alternative strategies have been discussed. Alternative surgical  options have been  reviewed as well including and most notably conventional  approach via median sternotomy versus minimally invasive approach with or  without robotic assistance. Mr. Dion understands and accepts all associated  risks of surgery including but not limited to risk of death, stroke,  myocardial infarction, congestive heart failure, respiratory failure,  arrhythmia, heart block with bradycardia requiring permanent pacemaker, late  complications related to valve repair, possible need for valve replacement  if repair is not technically feasible. All their questions have been  addressed. We tentatively plan to proceed with surgery on Wednesday,  September27. I feel that, based on the findings of transesophageal  echocardiogram, Mr. Zepeda's valve in all likelihood will be repairable  without significant complication. If for some reason repair is not feasible,  we will plan to use a mechanical prosthesis for replacement. All of his questions been addressed. In addition, I have also given Mr. Quirion a  prescription for amiodarone 200 mg twice daily to begin presently to load  him prior to surgery in effort to decrease the likelihood of perioperative  arrhythmias.      Salvatore Decent. Cornelius Moras, M.D.  Electronically Signed     CHO/MEDQ  D:  04/06/2005  T:  04/06/2005  Job:  [id number]   cc:   Rollene Rotunda, M.D.  [address]  [address]  [place]  [place] [address]   Ernestina Penna, M.D.  [address] [place]  [place] [address]  Fax: [phone]

## 2011-01-23 ENCOUNTER — Encounter: Payer: Self-pay | Admitting: Cardiology

## 2011-06-02 ENCOUNTER — Ambulatory Visit (INDEPENDENT_AMBULATORY_CARE_PROVIDER_SITE_OTHER): Payer: BC Managed Care – PPO | Admitting: General Surgery

## 2011-06-02 ENCOUNTER — Encounter (INDEPENDENT_AMBULATORY_CARE_PROVIDER_SITE_OTHER): Payer: Self-pay | Admitting: General Surgery

## 2011-06-02 VITALS — BP 118/88 | HR 64 | Temp 97.0°F | Resp 18 | Ht 68.0 in | Wt 177.5 lb

## 2011-06-02 DIAGNOSIS — R1031 Right lower quadrant pain: Secondary | ICD-10-CM

## 2011-06-02 NOTE — Patient Instructions (Signed)
Try to refrain form strenuous activity if possible

## 2011-06-03 ENCOUNTER — Encounter (INDEPENDENT_AMBULATORY_CARE_PROVIDER_SITE_OTHER): Payer: Self-pay | Admitting: General Surgery

## 2011-06-03 NOTE — Progress Notes (Signed)
Subjective:     Patient ID: Craig Duke, male   DOB: [birth year], 55 y.o.   MRN: [id number]  HPI The patient is a 55 year old white male who is now about 2 years out from bilateral inguinal hernia repairs with mesh. He's been doing well but over the last month or so he is been experiencing some right groin pain. He describes it as a sharp pain. It tends to come and go. He has it several times a week. At times it seems direct around the right hip area. He denies any nausea or vomiting. No fevers or chills. His appetite has been good and his bowels are working normally. He mostly notices the pain either after kayaking or during sex.  Review of Systems  Constitutional: Negative.   HENT: Negative.   Eyes: Negative.   Respiratory: Negative.   Cardiovascular: Negative.   Gastrointestinal: Negative.   Genitourinary: Negative.   Musculoskeletal: Negative.   Skin: Negative.   Neurological: Negative.   Hematological: Negative.   Psychiatric/Behavioral: Negative.        Objective:   Physical Exam  Constitutional: He is oriented to person, place, and time. He appears well-developed and well-nourished.  HENT:  Head: Normocephalic and atraumatic.  Eyes: Conjunctivae and EOM are normal. Pupils are equal, round, and reactive to light.  Neck: Normal range of motion. Neck supple.  Cardiovascular: Normal rate, regular rhythm and normal heart sounds.   Pulmonary/Chest: Effort normal and breath sounds normal.  Abdominal: Soft. Bowel sounds are normal.  Genitourinary: Penis normal.       The patient has no palpable bulge or impulse with straining in either groin. His abdominal wall especially on the right feels very solid. I do not appreciate recurrence of the hernia.  Musculoskeletal: Normal range of motion.  Neurological: He is alert and oriented to person, place, and time.  Skin: Skin is warm and dry.  Psychiatric: He has a normal mood and affect. His behavior is normal.       Assessment:    New right groin pain 2 years after bilateral inguinal hernia repairs with mesh.    Plan:     At this point I cannot appreciate a recurrence of the hernia on physical exam. I have offered to her a CT scan of his pelvis to look for any radiographic evidence of a hernia. At this point he would like to wait and see what can happen over time. I recommended rest and ibuprofen. He may also want to try different positions with his partner that may not causing discomfort. We will plan to see him back in about 2 months.

## 2011-08-04 ENCOUNTER — Encounter (INDEPENDENT_AMBULATORY_CARE_PROVIDER_SITE_OTHER): Payer: BC Managed Care – PPO | Admitting: General Surgery

## 2012-02-02 ENCOUNTER — Other Ambulatory Visit: Payer: Self-pay | Admitting: Family Medicine

## 2012-02-02 DIAGNOSIS — R1011 Right upper quadrant pain: Secondary | ICD-10-CM

## 2012-02-05 ENCOUNTER — Other Ambulatory Visit: Payer: Self-pay | Admitting: Family Medicine

## 2012-02-05 ENCOUNTER — Ambulatory Visit
Admission: RE | Admit: 2012-02-05 | Discharge: 2012-02-05 | Disposition: A | Discharge status: Home / Self Care | Payer: BC Managed Care – PPO | Source: Ambulatory Visit | Attending: Family Medicine | Admitting: Family Medicine

## 2012-02-05 DIAGNOSIS — R1011 Right upper quadrant pain: Secondary | ICD-10-CM

## 2012-02-05 DIAGNOSIS — N2889 Other specified disorders of kidney and ureter: Secondary | ICD-10-CM

## 2012-02-08 ENCOUNTER — Other Ambulatory Visit: Payer: Self-pay | Admitting: Family Medicine

## 2012-02-08 ENCOUNTER — Ambulatory Visit
Admission: RE | Admit: 2012-02-08 | Discharge: 2012-02-08 | Disposition: A | Discharge status: Home / Self Care | Payer: BC Managed Care – PPO | Source: Ambulatory Visit | Attending: Family Medicine | Admitting: Family Medicine

## 2012-02-08 DIAGNOSIS — N2889 Other specified disorders of kidney and ureter: Secondary | ICD-10-CM

## 2012-02-08 MED ORDER — IOHEXOL 350 MG/ML SOLN
100.0000 mL | Freq: Once | INTRAVENOUS | Status: AC | PRN
  Administered 2012-02-08: 100 mL via INTRAVENOUS

## 2012-03-08 ENCOUNTER — Other Ambulatory Visit: Payer: Self-pay | Admitting: Urology

## 2012-03-08 ENCOUNTER — Other Ambulatory Visit (HOSPITAL_COMMUNITY): Payer: Self-pay | Admitting: Urology

## 2012-03-08 ENCOUNTER — Ambulatory Visit (HOSPITAL_COMMUNITY)
Admission: RE | Admit: 2012-03-08 | Discharge: 2012-03-08 | Disposition: A | Discharge status: Home / Self Care | Payer: BC Managed Care – PPO | Source: Ambulatory Visit | Attending: Urology | Admitting: Urology

## 2012-03-08 DIAGNOSIS — D4959 Neoplasm of unspecified behavior of other genitourinary organ: Secondary | ICD-10-CM

## 2012-03-08 DIAGNOSIS — Z01818 Encounter for other preprocedural examination: Secondary | ICD-10-CM | POA: Insufficient documentation

## 2012-03-10 ENCOUNTER — Encounter: Payer: Self-pay | Admitting: Cardiology

## 2012-03-10 ENCOUNTER — Ambulatory Visit (INDEPENDENT_AMBULATORY_CARE_PROVIDER_SITE_OTHER): Payer: BC Managed Care – PPO | Admitting: Cardiology

## 2012-03-10 VITALS — BP 109/73 | HR 66 | Ht 67.0 in | Wt 176.0 lb

## 2012-03-10 DIAGNOSIS — I059 Rheumatic mitral valve disease, unspecified: Secondary | ICD-10-CM

## 2012-03-10 DIAGNOSIS — I341 Nonrheumatic mitral (valve) prolapse: Secondary | ICD-10-CM

## 2012-03-10 NOTE — Progress Notes (Signed)
   HPI The patient presents for followup of mitral valve repair. He is here also for preoperative clearance. He's been found to have a renal mass and is going to have a robotic-assisted resection. Since I last saw him he is done well.  The patient denies any new symptoms such as chest discomfort, neck or arm discomfort. There has been no new shortness of breath, PND or orthopnea. There have been no reported palpitations, presyncope or syncope.   he is active though he doesn't exercise routinely. With activity such as splitting wood he denies any cardiovascular symptoms.   Allergies  Allergen Reactions  . Hydrocodone   . Oxycodone     Itching and nausea   . Penicillins     Current Outpatient Prescriptions  Medication Sig Dispense Refill  . aspirin 81 MG tablet Take 81 mg by mouth daily.        Marland Kitchen ibuprofen (ADVIL,MOTRIN) 800 MG tablet Take 800 mg by mouth every 8 (eight) hours as needed.        . rosuvastatin (CRESTOR) 20 MG tablet Take 20 mg by mouth daily.          Past Medical History  Diagnosis Date  . MVP (mitral valve prolapse)   . Dyslipidemia   . Depression     Resolved off medications  . Migraines   . IBS (irritable bowel syndrome)     Past Surgical History  Procedure Date  . Vasectomy   . Anal fissure repair     anal sphincterectomy  . Inguinal hernia repair 2010    Bilateral  . Mitral valve repair   . Sphyncterotomy 2004  . Knee surgery     ROS:  As stated in the HPI and negative for all other systems.   PHYSICAL EXAM BP 109/73  Pulse 66  Ht 5\' 7"  (1.702 m)  Wt 176 lb (79.833 kg)  BMI 27.57 kg/m2 GENERAL:  Well appearing HEENT:  Pupils equal round and reactive, fundi not visualized, oral mucosa unremarkable NECK:  No jugular venous distention, waveform within normal limits, carotid upstroke brisk and symmetric, no bruits, no thyromegaly LYMPHATICS:  No cervical, inguinal adenopathy LUNGS:  Clear to auscultation bilaterally BACK:  No CVA  tenderness CHEST:  Unremarkable HEART:  PMI not displaced or sustained,S1 and S2 within normal limits, no S3, no S4, no clicks, no rubs, no murmur, well healed surgical scar  ABD:  Flat, positive bowel sounds normal in frequency in pitch, no bruits, no rebound, no guarding, no midline pulsatile mass, no hepatomegaly, no splenomegaly EXT:  2 plus pulses throughout, no edema, no cyanosis no clubbing SKIN:  No rashes no nodules NEURO:  Cranial nerves II through XII grossly intact, motor grossly intact throughout PSYCH:  Cognitively intact, oriented to person place and time  EKG:  Sinus rhythm, rate 58, axis within normal limits, intervals within normal limits, no acute ST-T wave changes.  03/10/2012   ASSESSMENT AND PLAN  Preoperative evaluation- The patient has no symptoms. He's had no prior coronary disease. He has a very high functional level.   Therefore, based on ACC/AHA guidelines, the patient would be at acceptable risk for the planned procedure without further cardiovascular testing.  Mitral Valve Repair - I will follow this up with an echocardiogram. I suspect it is stable and no change in therapy or further evaluation will be needed.

## 2012-03-10 NOTE — Patient Instructions (Addendum)

## 2012-03-17 ENCOUNTER — Ambulatory Visit (HOSPITAL_COMMUNITY): Payer: BC Managed Care – PPO | Attending: Cardiovascular Disease

## 2012-03-17 DIAGNOSIS — I079 Rheumatic tricuspid valve disease, unspecified: Secondary | ICD-10-CM | POA: Insufficient documentation

## 2012-03-17 DIAGNOSIS — I059 Rheumatic mitral valve disease, unspecified: Secondary | ICD-10-CM

## 2012-03-17 DIAGNOSIS — I080 Rheumatic disorders of both mitral and aortic valves: Secondary | ICD-10-CM | POA: Insufficient documentation

## 2012-03-17 DIAGNOSIS — I341 Nonrheumatic mitral (valve) prolapse: Secondary | ICD-10-CM

## 2012-03-17 NOTE — Progress Notes (Signed)
Echocardiogram performed.  

## 2012-03-23 ENCOUNTER — Other Ambulatory Visit (HOSPITAL_COMMUNITY): Payer: BC Managed Care – PPO

## 2012-03-23 ENCOUNTER — Ambulatory Visit (HOSPITAL_COMMUNITY)
Admission: RE | Admit: 2012-03-23 | Discharge: 2012-03-23 | Disposition: A | Discharge status: Home / Self Care | Payer: BC Managed Care – PPO | Source: Ambulatory Visit | Attending: Urology | Admitting: Urology

## 2012-03-23 ENCOUNTER — Other Ambulatory Visit (HOSPITAL_COMMUNITY): Payer: Self-pay | Admitting: Urology

## 2012-03-23 DIAGNOSIS — D4959 Neoplasm of unspecified behavior of other genitourinary organ: Secondary | ICD-10-CM

## 2012-04-01 ENCOUNTER — Encounter (HOSPITAL_COMMUNITY): Payer: Self-pay | Admitting: Pharmacy Technician

## 2012-04-05 ENCOUNTER — Encounter (HOSPITAL_COMMUNITY): Payer: Self-pay

## 2012-04-05 ENCOUNTER — Encounter (HOSPITAL_COMMUNITY)
Admission: RE | Admit: 2012-04-05 | Discharge: 2012-04-05 | Disposition: A | Discharge status: Home / Self Care | Payer: BC Managed Care – PPO | Source: Ambulatory Visit | Attending: Urology | Admitting: Urology

## 2012-04-05 HISTORY — DX: Other specified postprocedural states: Z98.890

## 2012-04-05 HISTORY — DX: Gastro-esophageal reflux disease without esophagitis: K21.9

## 2012-04-05 HISTORY — DX: Other specified postprocedural states: R11.2

## 2012-04-05 HISTORY — DX: Chronic kidney disease, unspecified: N18.9

## 2012-04-05 LAB — BASIC METABOLIC PANEL
CO2: 27 mEq/L (ref 19–32)
Chloride: 102 mEq/L (ref 96–112)
Creatinine, Ser: 1.02 mg/dL (ref 0.50–1.35)
Potassium: 4.2 mEq/L (ref 3.5–5.1)
Sodium: 140 mEq/L (ref 135–145)

## 2012-04-05 LAB — CBC
HCT: 47 % (ref 39.0–52.0)
Hemoglobin: 15.8 g/dL (ref 13.0–17.0)
MCV: 86.4 fL (ref 78.0–100.0)
RBC: 5.44 MIL/uL (ref 4.22–5.81)
WBC: 5.7 10*3/uL (ref 4.0–10.5)

## 2012-04-05 NOTE — Pre-Procedure Instructions (Signed)
Eccho, EKG, LOV with clearance Dr Kirtland Bouchard 8/13 [facility], chest xray 9/13 [facility].  Utilized teach back method for instructions and bowel prep as per office

## 2012-04-05 NOTE — Patient Instructions (Signed)
20 JAMA DILONE  04/05/2012   Your procedure is scheduled on:  04/07/12  Thursday  Surgery [id number]  Report to [facility] at   0515    AM.  Call this number if you have problems the morning of surgery: [phone]     Or PST   [phone]  [facility]   Remember:   Do not eat food:After Midnight.tonight for bowel prep as per office  May have clear liquids: all day Wednesday  Until midnight Wednesday NIGHT   THEN NONE      INCREASE FLUIDS WEDNESDAY  Clear liquids include soda, tea, black coffee, Szczerba or grape juice, broth.  Take these medicines the morning of surgery with A SIP OF WATER: none   Do not wear jewelry, make-up or nail polish.  Do not wear lotions, powders, or perfumes. You may wear deodorant.  Do not shave 48 hours prior to surgery.  Do not bring valuables to the hospital.  Contacts, dentures or bridgework may not be worn into surgery.  Leave suitcase in the car. After surgery it may be brought to your room.  For patients admitted to the hospital, checkout time is 11:00 AM the day of discharge.   Patients discharged the day of surgery will not be allowed to drive home.  Name and phone number of your driver:     wife                                                                 Special Instructions: CHG Shower Use Special Wash: 1/2 bottle night before surgery and 1/2 bottle morning of surgery. REGULAR SOAP FACE AND PRIVATES                          MEN-MAY SHAVE FACE MORNING OF SURGERY  Please read over the following fact sheets that you were given: MRSA Information

## 2012-04-06 NOTE — H&P (Signed)
Chief Complaint     Left renal neoplasm   Reason For Visit  Reason for consult: To consider minimally invasive nephron sparing surgery for his left renal neoplasm Physician requesting consult: Dr. Ihor Gully PCP: Dr. Joycelyn Rua   History of Present Illness  Craig Duke is a 56 year old who developed significant right upper quadrant pain in July 2013 prompting an abdominal ultrasound for possible cholelithiasis. 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 On 02/08/12, he underwent a dedicated CT scan of the abdomen with and without contrast which demonstrated an enhancing 3.9 x 3.1 cm neoplasm off the lower pole of the left kidney. This mass is partailly exophytic although extends centrally and abuts the collecting system. There is no regional lymphadenopathy, renal vein/IVC involvement, contralateral worrisome renal lesions, and no evidence for abdominal metastases. He has no know family history of kidney cancer. He denies any gross hematuria, weight loss, or other symptoms. He continues to have a mild and dull intermittent right upper quadrant pain as he did when he was initially imaged. This is not typically bothersome to him he is aware of it.   His medical history is significant for an open mitral valve repair. He did not undergo mitral valve replacement and does not require anticoagulation. He has been followed by Dr. Antoine Poche although admittedly he is overdue to see him. He has not seen him in over 2 years. Furthermore, he does have significant difficulties with multiple pain medications including itching associated with oxycodone, GI upset associated with hydrocodone, and some GI upset associated with Nucynta although has tolerated this medication best of all. He has not tried tramadol.  Nephrometry: 7x    Past Medical History Problems    1. History of  Depression 311 2. History of  Hyperlipidemia 272.4  Surgical History Problems  1. History of  Anal Fissurectomy 2. History of  Hernia Repair Bilateral 3. History of  Knee Surgery Right 4. History of  Mitral Valve Repair  Current Meds 1. Aspir-81 81 MG Oral Tablet Delayed Release; Therapy: (Recorded:25Jul2013) to 2. Crestor 20 MG Oral Tablet; Therapy: 31Jan2013 to 3. Ibuprofen 200 MG Oral Tablet; Therapy: (Recorded:25Jul2013) to 4. Nucynta 50 MG Oral Tablet; Therapy: 10Apr2013 to  Allergies Medication  1. Hydrocodone-Acetaminophen TABS 2. OxyCODONE HCl TABS 3. Penicillins 4. Nucynta TABS   See above explanation.   Family History Problems  1. Family history of  Alzheimer's Disease 2. Family history of  Prostate Cancer V16.42  Social History Problems    Marital History - Currently Married   Never A Smoker Denied    History of  Alcohol Use   History of  Tobacco Use  Review of Systems Constitutional, skin, eye, otolaryngeal, hematologic/lymphatic, cardiovascular, pulmonary, endocrine, musculoskeletal, gastrointestinal, neurological and psychiatric system(s) were reviewed and pertinent findings if present are noted.  Gastrointestinal: abdominal pain.  Constitutional: no recent weight loss.    Vitals Vital Signs [Data Includes: Last 1 Day]  20Aug2013 08:54AM  BMI Calculated: 27.47 BSA Calculated: 1.91 Weight: 175 lb  Blood Pressure: 118 / 78 Heart Rate: 73  Physical Exam Constitutional: Well nourished and well developed . No acute distress.  ENT:. The ears and nose are normal in appearance.  Neck: The appearance of the neck is normal and no neck mass is present.  Pulmonary: No respiratory distress, normal respiratory rhythm and  effort and clear bilateral breath sounds.  Cardiovascular: Heart rate and rhythm are normal . No peripheral edema.  Abdomen: left inguinal, right inguinal incision site(s) well healed. The abdomen is soft and nontender.  No masses are palpated. No CVA tenderness. No hernias are palpable. No hepatosplenomegaly noted.  Lymphatics: The supraclavicular, femoral and inguinal nodes are not enlarged or tender.  Skin: Normal skin turgor, no visible rash and no visible skin lesions.  Neuro/Psych:. Mood and affect are appropriate.    Results/Data Urine [Data Includes: Last 1 Day]   20Aug2013  COLOR YELLOW   APPEARANCE CLEAR   SPECIFIC GRAVITY 1.025   pH 6.0   GLUCOSE NEG mg/dL  BILIRUBIN NEG   KETONE NEG mg/dL  BLOOD TRACE   PROTEIN NEG mg/dL  UROBILINOGEN 0.2 mg/dL  NITRITE NEG   LEUKOCYTE ESTERASE NEG   SQUAMOUS EPITHELIAL/HPF RARE   WBC 0-2 WBC/hpf  RBC 0-2 RBC/hpf  BACTERIA NONE SEEN   CRYSTALS NONE SEEN   CASTS NONE SEEN   Selected Results  COMPREHENSIVE METABOLIC PANEL 20Aug2013 09:59AM Craig Duke  SPECIMEN TYPE: BLOOD   Test Name Result Flag Reference  GLUCOSE 82 mg/dL  11-91  BUN 15 mg/dL  4-78  CREATININE 2.95 mg/dL  6.21-3.08  SODIUM 657 mEq/L  135-145  POTASSIUM 4.0 mEq/L  3.5-5.3  CHLORIDE 106 mEq/L  96-112  CO2 27 mEq/L  19-32  CALCIUM 9.6 mg/dL  8.4-69.6  TOTAL PROTEIN 6.5 g/dL  2.9-5.2  ALBUMIN 4.2 g/dL  8.4-1.3  AST/SGOT 19 U/L  0-37  ALT/SGPT 25 U/L  0-53  ALKALINE PHOSPHATASE 65 U/L  39-117  BILIRUBIN, TOTAL 0.5 mg/dL  2.4-4.0  Est GFR, African American 83 mL/min    Est GFR, NonAfrican American 72 mL/min    The estimated GFR is a calculation valid for adults (22 to 58 years old) that uses the CKD-EPI algorithm to adjust for age and sex. It is not to be used for children, pregnant women, hospitalized patients, patients on dialysis, or with rapidly changing kidney function. According to the NKDEP, eGFR >89 is normal, 60-89 shows mild impairment, 30-59 shows moderate impairment, 15-29 shows severe impairment and <15 is ESRD.     I independently reviewed his CT scan from 02/08/12 with findings as dictated above.  Assessment Assessed  1. Renal Neoplasm Left  239.5  Plan Health Maintenance (V70.0)  1. UA With REFLEX  Done: 20Aug2013 08:46AM Renal Neoplasm (239.5)  2. COMPREHENSIVE METABOLIC PANEL  Done: 20Aug2013 09:59AM 3. VENIPUNCTURE  Done: 20Aug2013 4. Follow-up Schedule Surgery Office  Follow-up  Done: 20Aug2013  Discussion/Summary  1. Left renal neoplasm concerning for malignancy: I have recommended that he proceed with his metastatic evaluation including chest imaging and further laboratory studies today to evaluate his renal and liver function. Assuming he does not have evidence for metastatic disease, I have recommended proceeding with left nephron sparing surgery for his left renal mass. He will plan to see Dr. Antoine Poche in the near future and prior to surgery since he is overdue for his cardiology followup.   The patient was provided information regarding their renal mass including the relative risk of benign versus malignant pathology and the natural history of renal cell carcinoma and other possible malignancies of the kidney. The role of renal biopsy, laboratory testing, and imaging studies to further characterize renal masses and/or the presence of metastatic disease were explained. We discussed the role of active surveillance, surgical therapy with both radical nephrectomy and nephron-sparing surgery, and ablative therapy in the treatment  of renal masses. In addition, we discussed our goals of providing an accurate diagnosis and oncologic control while maintaining optimal renal function as appropriate based on the size, location, and complexity of their renal mass as well as their co-morbidities.    We have discussed the risks of treatment in detail including but not limited to bleeding, infection, heart attack, stroke, death, venothromoboembolism, cancer recurrence, injury/damage to surrounding organs and structures, urine leak, the possibility of open surgical conversion for patients undergoing minimally invasive surgery, the risk of  developing chronic kidney disease and its associated implications, and the potential risk of end stage renal disease possibly necessitating dialysis.   CC: Dr. Ihor Gully Dr. Joycelyn Rua

## 2012-04-06 NOTE — Anesthesia Preprocedure Evaluation (Addendum)
Anesthesia Evaluation  Patient identified by MRN, date of birth, ID band Patient awake    Reviewed: Allergy & Precautions, H&P , NPO status , Patient's Chart, lab work & pertinent test results  History of Anesthesia Complications (+) PONV  Airway Mallampati: II TM Distance: >3 FB Neck ROM: full    Dental No notable dental hx. (+) Teeth Intact and Dental Advisory Given   Pulmonary neg pulmonary ROS,  breath sounds clear to auscultation  Pulmonary exam normal       Cardiovascular Exercise Tolerance: Good negative cardio ROS  + Valvular Problems/Murmurs MVP Rhythm:regular Rate:Normal  He said that the MVP was repaired?   Neuro/Psych Depression negative neurological ROS  negative psych ROS   GI/Hepatic negative GI ROS, Neg liver ROS, GERD-  Controlled,  Endo/Other  negative endocrine ROS  Renal/GU negative Renal ROS  negative genitourinary   Musculoskeletal   Abdominal   Peds  Hematology negative hematology ROS (+)   Anesthesia Other Findings   Reproductive/Obstetrics negative OB ROS                          Anesthesia Physical Anesthesia Plan  ASA: II  Anesthesia Plan: General   Post-op Pain Management:    Induction: Intravenous  Airway Management Planned: Oral ETT  Additional Equipment:   Intra-op Plan:   Post-operative Plan: Extubation in OR  Informed Consent: I have reviewed the patients History and Physical, chart, labs and discussed the procedure including the risks, benefits and alternatives for the proposed anesthesia with the patient or authorized representative who has indicated his/her understanding and acceptance.   Dental Advisory Given  Plan Discussed with: CRNA and Surgeon  Anesthesia Plan Comments:         Anesthesia Quick Evaluation

## 2012-04-07 ENCOUNTER — Encounter (HOSPITAL_COMMUNITY): Payer: Self-pay | Admitting: Anesthesiology

## 2012-04-07 ENCOUNTER — Inpatient Hospital Stay (HOSPITAL_COMMUNITY)
Admission: RE | Admit: 2012-04-07 | Discharge: 2012-04-10 | DRG: 303 | Disposition: A | Discharge status: Home / Self Care | Payer: BC Managed Care – PPO | Source: Ambulatory Visit | Attending: Urology | Admitting: Urology

## 2012-04-07 ENCOUNTER — Encounter (HOSPITAL_COMMUNITY): Payer: Self-pay | Admitting: *Deleted

## 2012-04-07 ENCOUNTER — Encounter (HOSPITAL_COMMUNITY)
Admission: RE | Disposition: A | Discharge status: Home / Self Care | Payer: Self-pay | Source: Ambulatory Visit | Attending: Urology

## 2012-04-07 ENCOUNTER — Inpatient Hospital Stay (HOSPITAL_COMMUNITY): Payer: BC Managed Care – PPO | Admitting: Anesthesiology

## 2012-04-07 DIAGNOSIS — E785 Hyperlipidemia, unspecified: Secondary | ICD-10-CM | POA: Diagnosis present

## 2012-04-07 DIAGNOSIS — C649 Malignant neoplasm of unspecified kidney, except renal pelvis: Principal | ICD-10-CM | POA: Diagnosis present

## 2012-04-07 DIAGNOSIS — Z954 Presence of other heart-valve replacement: Secondary | ICD-10-CM

## 2012-04-07 DIAGNOSIS — K219 Gastro-esophageal reflux disease without esophagitis: Secondary | ICD-10-CM | POA: Diagnosis present

## 2012-04-07 DIAGNOSIS — F329 Major depressive disorder, single episode, unspecified: Secondary | ICD-10-CM | POA: Diagnosis present

## 2012-04-07 DIAGNOSIS — F3289 Other specified depressive episodes: Secondary | ICD-10-CM | POA: Diagnosis present

## 2012-04-07 LAB — TYPE AND SCREEN
ABO/RH(D): O POS
Antibody Screen: NEGATIVE

## 2012-04-07 LAB — BASIC METABOLIC PANEL
Chloride: 100 mEq/L (ref 96–112)
GFR calc Af Amer: 83 mL/min — ABNORMAL LOW (ref >90)
GFR calc non Af Amer: 71 mL/min — ABNORMAL LOW (ref >90)
Glucose, Bld: 126 mg/dL — ABNORMAL HIGH (ref 70–99)
Potassium: 4 mEq/L (ref 3.5–5.1)
Sodium: 133 mEq/L — ABNORMAL LOW (ref 135–145)

## 2012-04-07 LAB — ABO/RH: ABO/RH(D): O POS

## 2012-04-07 LAB — HEMOGLOBIN AND HEMATOCRIT, BLOOD: HCT: 41.5 % (ref 39.0–52.0)

## 2012-04-07 SURGERY — ROBOTIC ASSITED PARTIAL NEPHRECTOMY
Anesthesia: General | Laterality: Left | Wound class: Clean

## 2012-04-07 MED ORDER — SUCCINYLCHOLINE CHLORIDE 20 MG/ML IJ SOLN
INTRAMUSCULAR | Status: DC | PRN
  Administered 2012-04-07: 100 mg via INTRAVENOUS

## 2012-04-07 MED ORDER — FENTANYL CITRATE 0.05 MG/ML IJ SOLN
INTRAMUSCULAR | Status: DC | PRN
  Administered 2012-04-07: 50 ug via INTRAVENOUS
  Administered 2012-04-07 (×4): 100 ug via INTRAVENOUS

## 2012-04-07 MED ORDER — MIDAZOLAM HCL 5 MG/5ML IJ SOLN
INTRAMUSCULAR | Status: DC | PRN
  Administered 2012-04-07 (×2): 1 mg via INTRAVENOUS

## 2012-04-07 MED ORDER — PROPOFOL 10 MG/ML IV EMUL
INTRAVENOUS | Status: DC | PRN
  Administered 2012-04-07: 25 mg via INTRAVENOUS
  Administered 2012-04-07: 200 mg via INTRAVENOUS
  Administered 2012-04-07 (×3): 25 mg via INTRAVENOUS

## 2012-04-07 MED ORDER — LIDOCAINE HCL (CARDIAC) 20 MG/ML IV SOLN
INTRAVENOUS | Status: DC | PRN
  Administered 2012-04-07: 100 mg via INTRAVENOUS

## 2012-04-07 MED ORDER — ACETAMINOPHEN 10 MG/ML IV SOLN
1000.0000 mg | Freq: Four times a day (QID) | INTRAVENOUS | Status: AC
  Administered 2012-04-07 – 2012-04-08 (×4): 1000 mg via INTRAVENOUS
  Filled 2012-04-07 (×4): qty 100

## 2012-04-07 MED ORDER — MANNITOL 25 % IV SOLN
INTRAVENOUS | Status: DC | PRN
  Administered 2012-04-07 (×2): 12.5 g via INTRAVENOUS

## 2012-04-07 MED ORDER — DIPHENHYDRAMINE HCL 12.5 MG/5ML PO ELIX: 12.5000 mg | ORAL_SOLUTION | Freq: Four times a day (QID) | ORAL | Status: DC | PRN

## 2012-04-07 MED ORDER — DIPHENHYDRAMINE HCL 50 MG/ML IJ SOLN: 25.0000 mg | INTRAMUSCULAR | Status: DC | PRN

## 2012-04-07 MED ORDER — ZOLPIDEM TARTRATE 5 MG PO TABS: 5.0000 mg | ORAL_TABLET | Freq: Every evening | ORAL | Status: DC | PRN

## 2012-04-07 MED ORDER — ATORVASTATIN CALCIUM 40 MG PO TABS
40.0000 mg | ORAL_TABLET | Freq: Every day | ORAL | Status: DC
  Administered 2012-04-07 – 2012-04-09 (×3): 40 mg via ORAL
  Filled 2012-04-07 (×4): qty 1

## 2012-04-07 MED ORDER — LACTATED RINGERS IV SOLN: INTRAVENOUS | Status: DC

## 2012-04-07 MED ORDER — CISATRACURIUM BESYLATE (PF) 10 MG/5ML IV SOLN
INTRAVENOUS | Status: DC | PRN
  Administered 2012-04-07: 4 mg via INTRAVENOUS
  Administered 2012-04-07: 10 mg via INTRAVENOUS

## 2012-04-07 MED ORDER — METOCLOPRAMIDE HCL 5 MG/ML IJ SOLN
INTRAMUSCULAR | Status: DC | PRN
  Administered 2012-04-07: 5 mg via INTRAVENOUS

## 2012-04-07 MED ORDER — ACETAMINOPHEN 10 MG/ML IV SOLN
INTRAVENOUS | Status: DC | PRN
  Administered 2012-04-07: 1000 mg via INTRAVENOUS

## 2012-04-07 MED ORDER — MORPHINE SULFATE 2 MG/ML IJ SOLN
2.0000 mg | INTRAMUSCULAR | Status: DC | PRN
  Administered 2012-04-07 – 2012-04-08 (×4): 2 mg via INTRAVENOUS
  Filled 2012-04-07 (×4): qty 1

## 2012-04-07 MED ORDER — HYDROMORPHONE HCL PF 1 MG/ML IJ SOLN
0.2500 mg | INTRAMUSCULAR | Status: DC | PRN
  Administered 2012-04-07 (×3): 0.25 mg via INTRAVENOUS

## 2012-04-07 MED ORDER — VANCOMYCIN HCL IN DEXTROSE 1-5 GM/200ML-% IV SOLN
1000.0000 mg | Freq: Two times a day (BID) | INTRAVENOUS | Status: AC
  Administered 2012-04-07: 1000 mg via INTRAVENOUS
  Filled 2012-04-07 (×2): qty 200

## 2012-04-07 MED ORDER — DOCUSATE SODIUM 100 MG PO CAPS
100.0000 mg | ORAL_CAPSULE | Freq: Two times a day (BID) | ORAL | Status: DC
  Administered 2012-04-07 – 2012-04-10 (×6): 100 mg via ORAL
  Filled 2012-04-07 (×7): qty 1

## 2012-04-07 MED ORDER — SCOPOLAMINE 1 MG/3DAYS TD PT72
MEDICATED_PATCH | TRANSDERMAL | Status: DC | PRN
  Administered 2012-04-07: 1 via TRANSDERMAL

## 2012-04-07 MED ORDER — DEXTROSE-NACL 5-0.45 % IV SOLN
INTRAVENOUS | Status: DC
  Administered 2012-04-07 – 2012-04-09 (×3): via INTRAVENOUS

## 2012-04-07 MED ORDER — VANCOMYCIN HCL IN DEXTROSE 1-5 GM/200ML-% IV SOLN
1000.0000 mg | INTRAVENOUS | Status: AC
  Administered 2012-04-07: 1000 mg via INTRAVENOUS

## 2012-04-07 MED ORDER — ONDANSETRON HCL 4 MG/2ML IJ SOLN
INTRAMUSCULAR | Status: DC | PRN
  Administered 2012-04-07 (×4): 2 mg via INTRAVENOUS

## 2012-04-07 MED ORDER — LACTATED RINGERS IR SOLN
Status: DC | PRN
  Administered 2012-04-07: 1000 mL

## 2012-04-07 MED ORDER — ONDANSETRON HCL 4 MG/2ML IJ SOLN
4.0000 mg | INTRAMUSCULAR | Status: DC | PRN
  Administered 2012-04-08 – 2012-04-09 (×2): 4 mg via INTRAVENOUS
  Filled 2012-04-07 (×2): qty 2

## 2012-04-07 MED ORDER — STERILE WATER FOR IRRIGATION IR SOLN
Status: DC | PRN
  Administered 2012-04-07: 3000 mL

## 2012-04-07 MED ORDER — DEXAMETHASONE SODIUM PHOSPHATE 10 MG/ML IJ SOLN
INTRAMUSCULAR | Status: DC | PRN
  Administered 2012-04-07: 10 mg via INTRAVENOUS

## 2012-04-07 MED ORDER — LACTATED RINGERS IV SOLN
INTRAVENOUS | Status: DC | PRN
  Administered 2012-04-07 (×3): via INTRAVENOUS

## 2012-04-07 MED ORDER — BUPIVACAINE LIPOSOME 1.3 % IJ SUSP
20.0000 mL | Freq: Once | INTRAMUSCULAR | Status: AC
  Administered 2012-04-07: 40 mL
  Filled 2012-04-07: qty 20

## 2012-04-07 MED ORDER — DIPHENHYDRAMINE HCL 50 MG/ML IJ SOLN: 12.5000 mg | Freq: Four times a day (QID) | INTRAMUSCULAR | Status: DC | PRN

## 2012-04-07 SURGICAL SUPPLY — 65 items
ADH SKN CLS APL DERMABOND .7 (GAUZE/BANDAGES/DRESSINGS)
APL ESCP 34 STRL LF DISP (HEMOSTASIS)
APPLICATOR SURGIFLO ENDO (HEMOSTASIS) IMPLANT
BAG SPEC RTRVL LRG 6X4 10 (ENDOMECHANICALS) ×1
CANNULA SEAL DVNC (CANNULA) ×3 IMPLANT
CANNULA SEALS DA VINCI (CANNULA) ×3
CHLORAPREP W/TINT 26ML (MISCELLANEOUS) ×2 IMPLANT
CLIP LIGATING HEM O LOK PURPLE (MISCELLANEOUS) ×1 IMPLANT
CLIP LIGATING HEMO O LOK GREEN (MISCELLANEOUS) ×1 IMPLANT
CLOTH BEACON ORANGE TIMEOUT ST (SAFETY) ×2 IMPLANT
CORDS BIPOLAR (ELECTRODE) ×2 IMPLANT
COVER SURGICAL LIGHT HANDLE (MISCELLANEOUS) ×2 IMPLANT
COVER TIP SHEARS 8 DVNC (MISCELLANEOUS) ×1 IMPLANT
COVER TIP SHEARS 8MM DA VINCI (MISCELLANEOUS) ×1
DECANTER SPIKE VIAL GLASS SM (MISCELLANEOUS) ×1 IMPLANT
DERMABOND ADVANCED (GAUZE/BANDAGES/DRESSINGS)
DERMABOND ADVANCED .7 DNX12 (GAUZE/BANDAGES/DRESSINGS) ×2 IMPLANT
DRAIN CHANNEL 15F RND FF 3/16 (WOUND CARE) ×2 IMPLANT
DRAPE INCISE IOBAN 66X45 STRL (DRAPES) ×2 IMPLANT
DRAPE LAPAROSCOPIC ABDOMINAL (DRAPES) ×2 IMPLANT
DRAPE LG THREE QUARTER DISP (DRAPES) ×3 IMPLANT
DRAPE TABLE BACK 44X90 PK DISP (DRAPES) ×2 IMPLANT
DRAPE WARM FLUID 44X44 (DRAPE) ×2 IMPLANT
DRESSING SURGICEL FIBRLLR 1X2 (HEMOSTASIS) ×1 IMPLANT
DRSG SURGICEL FIBRILLAR 1X2 (HEMOSTASIS)
ELECT REM PT RETURN 9FT ADLT (ELECTROSURGICAL) ×4
ELECTRODE REM PT RTRN 9FT ADLT (ELECTROSURGICAL) ×2 IMPLANT
EVACUATOR SILICONE 100CC (DRAIN) ×2 IMPLANT
GAUZE VASELINE 3X9 (GAUZE/BANDAGES/DRESSINGS) IMPLANT
GLOVE BIO SURGEON STRL SZ 6.5 (GLOVE) ×2 IMPLANT
GLOVE BIOGEL M STRL SZ7.5 (GLOVE) ×4 IMPLANT
GOWN STRL NON-REIN LRG LVL3 (GOWN DISPOSABLE) ×6 IMPLANT
GOWN STRL REIN XL XLG (GOWN DISPOSABLE) ×2 IMPLANT
HEMOSTAT SURGICEL 4X8 (HEMOSTASIS) IMPLANT
KIT ACCESSORY DA VINCI DISP (KITS) ×1
KIT ACCESSORY DVNC DISP (KITS) ×1 IMPLANT
KIT BASIN OR (CUSTOM PROCEDURE TRAY) ×2 IMPLANT
NS IRRIG 1000ML POUR BTL (IV SOLUTION) ×1 IMPLANT
PENCIL BUTTON HOLSTER BLD 10FT (ELECTRODE) ×1 IMPLANT
POSITIONER SURGICAL ARM (MISCELLANEOUS) ×4 IMPLANT
POUCH SPECIMEN RETRIEVAL 10MM (ENDOMECHANICALS) ×2 IMPLANT
SET TUBE IRRIG SUCTION NO TIP (IRRIGATION / IRRIGATOR) ×1 IMPLANT
SOLUTION ANTI FOG 6CC (MISCELLANEOUS) ×2 IMPLANT
SOLUTION ELECTROLUBE (MISCELLANEOUS) ×2 IMPLANT
SPONGE LAP 18X18 X RAY DECT (DISPOSABLE) ×1 IMPLANT
SURGIFLO W/THROMBIN 8M KIT (HEMOSTASIS) ×2 IMPLANT
SUT ETHILON 3 0 PS 1 (SUTURE) ×2 IMPLANT
SUT MNCRL AB 4-0 PS2 18 (SUTURE) ×4 IMPLANT
SUT V-LOC BARB 180 2/0GR6 GS22 (SUTURE) ×4
SUT V-LOC BARB 180 2/0GR9 GS23 (SUTURE)
SUT VIC AB 0 CT1 27 (SUTURE) ×2
SUT VIC AB 0 CT1 27XBRD ANTBC (SUTURE) ×2 IMPLANT
SUT VICRYL 0 UR6 27IN ABS (SUTURE) ×4 IMPLANT
SUT VLOC BARB 180 ABS3/0GR12 (SUTURE) ×2
SUTURE V-LC BRB 180 2/0GR6GS22 (SUTURE) IMPLANT
SUTURE V-LC BRB 180 2/0GR9GS23 (SUTURE) IMPLANT
SUTURE VLOC BRB 180 ABS3/0GR12 (SUTURE) IMPLANT
SYR BULB IRRIGATION 50ML (SYRINGE) IMPLANT
TOWEL OR NON WOVEN STRL DISP B (DISPOSABLE) ×2 IMPLANT
TRAY FOLEY CATH 14FRSI W/METER (CATHETERS) ×2 IMPLANT
TRAY LAP CHOLE (CUSTOM PROCEDURE TRAY) ×2 IMPLANT
TROCAR ENDOPATH XCEL 12X100 BL (ENDOMECHANICALS) ×2 IMPLANT
TROCAR XCEL 12X100 BLDLESS (ENDOMECHANICALS) ×2 IMPLANT
TUBING INSUFFLATION 10FT LAP (TUBING) ×2 IMPLANT
WATER STERILE IRR 1500ML POUR (IV SOLUTION) ×4 IMPLANT

## 2012-04-07 NOTE — Transfer of Care (Signed)
Immediate Anesthesia Transfer of Care Note  Patient: Craig Duke  Procedure(s) Performed: Procedure(s) (LRB) with comments: ROBOTIC ASSITED PARTIAL NEPHRECTOMY (Left) -    Patient Location: PACU  Anesthesia Type: General  Level of Consciousness: awake, pateint uncooperative, confused, lethargic and responds to stimulation  Airway & Oxygen Therapy: Patient Spontanous Breathing and Patient connected to face mask oxygen  Post-op Assessment: Report given to PACU RN, Post -op Vital signs reviewed and stable and Patient moving all extremities  Post vital signs: Reviewed and stable  Complications: No apparent anesthesia complications

## 2012-04-07 NOTE — Interval H&P Note (Signed)
History and Physical Interval Note:  04/07/2012 7:12 AM  Craig Duke  has presented today for surgery, with the diagnosis of LEFT RENAL NEOPLASM  The various methods of treatment have been discussed with the patient and family. After consideration of risks, benefits and other options for treatment, the patient has consented to  Procedure(s) (LRB) with comments: ROBOTIC ASSITED PARTIAL NEPHRECTOMY (Left) -   as a surgical intervention .  The patient's history has been reviewed, patient examined, no change in status, stable for surgery.  I have reviewed the patient's chart and labs.  Questions were answered to the patient's satisfaction.     Dequane Strahan,LES

## 2012-04-07 NOTE — Preoperative (Signed)
Beta Blockers   Reason not to administer Beta Blockers:Not Applicable 

## 2012-04-07 NOTE — Anesthesia Postprocedure Evaluation (Signed)
  Anesthesia Post-op Note  Patient: Craig Duke  Procedure(s) Performed: Procedure(s) (LRB): ROBOTIC ASSITED PARTIAL NEPHRECTOMY (Left)  Patient Location: PACU  Anesthesia Type: General  Level of Consciousness: awake and alert   Airway and Oxygen Therapy: Patient Spontanous Breathing  Post-op Pain: mild  Post-op Assessment: Post-op Vital signs reviewed, Patient's Cardiovascular Status Stable, Respiratory Function Stable, Patent Airway and No signs of Nausea or vomiting  Post-op Vital Signs: stable  Complications: No apparent anesthesia complications

## 2012-04-07 NOTE — Anesthesia Procedure Notes (Signed)
Procedure Name: Intubation Date/Time: 04/07/2012 7:28 AM Performed by: Edison Pace Pre-anesthesia Checklist: Patient identified, Timeout performed, Emergency Drugs available, Suction available and Patient being monitored Patient Re-evaluated:Patient Re-evaluated prior to inductionOxygen Delivery Method: Circle system utilized Preoxygenation: Pre-oxygenation with 100% oxygen Intubation Type: IV induction Ventilation: Mask ventilation without difficulty Laryngoscope Size: Mac and 4 Grade View: Grade II Tube type: Oral Tube size: 7.5 mm Airway Equipment and Method: Stylet Placement Confirmation: ETT inserted through vocal cords under direct vision,  positive ETCO2 and breath sounds checked- equal and bilateral Secured at: 20.5 cm Tube secured with: Tape Dental Injury: Teeth and Oropharynx as per pre-operative assessment  Difficulty Due To: Difficulty was anticipated Future Recommendations: Recommend- induction with short-acting agent, and alternative techniques readily available

## 2012-04-07 NOTE — Op Note (Addendum)
Preoperative diagnosis: Left renal neoplasm  Postoperative diagnosis: Left renal neoplasm  Procedure:  1. Left robotic-assisted laparoscopic partial nephrectomy  Surgeon: Moody Bruins. M.D.  Assistant(s): Pecola Leisure, PA-C  Anesthesia: General  Complications: None  EBL: 100 mL  IVF:  2250 mL crystalloid  Specimens: 1. Left renal neoplasm 2.   Left renal tumor margin  Disposition of specimens: Pathology  Intraoperative findings:       1. Warm renal ischemia time: 21 minutes       2. Intraoperative tumor margin was negative.  Drains: 1. # 15 Blake perinephric drain  Indication:  Craig Duke is a 56 y.o. year old patient with a left renal neoplasm.  After a thorough review of the management options for their renal mass, they elected to proceed with surgical treatment and the above procedure.  We have discussed the potential benefits and risks of the procedure, side effects of the proposed treatment, the likelihood of the patient achieving the goals of the procedure, and any potential problems that might occur during the procedure or recuperation. Informed consent has been obtained.   Description of procedure:  The patient was taken to the operating room and a general anesthetic was administered. The patient was given preoperative antibiotics, placed in the left modified flank position with care to pad all potential pressure points, and prepped and draped in the usual sterile fashion. Next a preoperative timeout was performed.  A site was selected on the left side of the umbilicus for placement of the camera port. This was placed using a standard open Hassan technique which allowed entry into the peritoneal cavity under direct vision and without difficulty. A 12 mm port was placed and a pneumoperitoneum established. The camera was then used to inspect the abdomen and there was no evidence of any intra-abdominal injuries or other abnormalities. The remaining  abdominal ports were then placed. 8 mm robotic ports were placed in the left upper quadrant, left lower quadrant, and far left lateral abdominal wall. A 12 mm port was placed in the upper midline for laparoscopic assistance. All ports were placed under direct vision without difficulty. The surgical cart was then docked.   Utilizing the cautery scissors, the white line of Toldt was incised allowing the colon to be mobilized medially and the plane between the mesocolon and the anterior layer of Gerota's fascia to be developed and the kidney to be exposed.  The ureter and gonadal vein were identified inferiorly and the ureter was lifted anteriorly off the psoas muscle.  Dissection proceeded superiorly along the gonadal vein until the renal vein was identified.  The renal hilum was then carefully isolated with a combination of blunt and sharp dissection allowing the renal arterial and venous structures to be separated and isolated in preparation for renal hilar vessel clamping. A single renal artery was noted consistent with preoperative imaging.  12.5 g of IV mannitol was then administered.   Attention turned to the kidney and the perinephric fat surrounding the renal mass was removed and the kidney was mobilized sufficiently for exposure and resection of the renal mass.    Once the renal mass was properly isolated, preparations were made for resection of the tumor.  Reconstructive sutures were placed into the abdomen for the renorrhaphy portion of the procedure.  The renal artery was then clamped with bulldog clamps.  The tumor was then excised with cold scissor dissection along with an adequate visible gross margin of normal renal parenchyma. The tumor appeared  to be excised without any gross violation of the tumor. To ensure negative margins, an area of renal parenchyma on the lateral border of the resection was sent for frozen section analysis and returned negative for malignancy. The renal collecting system  was entered during removal of the tumor.  A running 3-0 V-lock suture was then brought through the capsule of the kidney and run along the base of the renal defect to provide hemostasis and close any entry into the renal collecting system if present. Weck clips were used to secure this suture outside the renal capsule at the proximal and distal ends.Surgiflo was then placed into the renal defect and the renal parenchyma was closed with a 2-0 V-lock horizontal mattress capsular suture which resulted in excellent compression of the renal defect.    The bulldog clamps were then removed from the renal hilar vessel(s) and an additional 12.5 g of IV mannitol was administered. Total warm renal ischemia time was 21 minutes. The renal tumor resection site was examined. Hemostasis appeared adequate.   The kidney was placed back into its normal anatomic position and covered with perinephric fat as needed.  A # 15 Blake drain was then brought through the lateral lower port site and positioned in the perinephric space.  It was secured to the skin with a nylon suture. The surgical cart was undocked.  The renal tumor specimen was removed intact within an endopouch retrieval bag via the camera port sites.  The camera port site and the other 12 mm port site were then closed at the fascial level with 0-vicryl suture.  All other laparoscopic/robotic ports were removed under direct vision and the pneumoperitoneum let down with inspection of the operative field performed and hemostasis again confirmed. All incision sites were then injected with local anesthetic and reapproximated at the skin level with 4-0 monocryl subcuticular closures.  Dermabond was applied to the skin.  The patient tolerated the procedure well and without complications.  The patient was able to be extubated and transferred to the recovery unit in satisfactory condition.  Moody Bruins MD

## 2012-04-07 NOTE — Care Management Note (Unsigned)
    Page 1 of 1   04/07/2012     12:28:05 PM   CARE MANAGEMENT NOTE 04/07/2012  Patient:  Craig Duke,Craig Duke   Account Number:  [id number]  Date Initiated:  04/07/2012  Documentation initiated by:  Lanier Clam  Subjective/Objective Assessment:   ADMITTED W/L RENAL NEOPLASM     Action/Plan:   FROM  HOME W/SPOUSE.   Anticipated DC Date:  04/08/2012   Anticipated DC Plan:  HOME/SELF CARE      DC Planning Services  CM consult      Choice offered to / List presented to:             Status of service:  In process, will continue to follow Medicare Important Message given?   (If response is "NO", the following Medicare IM given date fields will be blank) Date Medicare IM given:   Date Additional Medicare IM given:    Discharge Disposition:    Per UR Regulation:  Reviewed for med. necessity/level of care/duration of stay  If discussed at Long Length of Stay Meetings, dates discussed:    Comments:  04/07/12 Jakelyn Squyres RN,BSN NCM [phone] S/P LAP NEPHRECTOMY.

## 2012-04-08 LAB — BASIC METABOLIC PANEL
BUN: 11 mg/dL (ref 6–23)
CO2: 27 mEq/L (ref 19–32)
Calcium: 9.1 mg/dL (ref 8.4–10.5)
Creatinine, Ser: 1.3 mg/dL (ref 0.50–1.35)
GFR calc non Af Amer: 59 mL/min — ABNORMAL LOW (ref >90)
Glucose, Bld: 126 mg/dL — ABNORMAL HIGH (ref 70–99)

## 2012-04-08 LAB — HEMOGLOBIN AND HEMATOCRIT, BLOOD: HCT: 43 % (ref 39.0–52.0)

## 2012-04-08 MED ORDER — TRAMADOL HCL 50 MG PO TABS
50.0000 mg | ORAL_TABLET | Freq: Four times a day (QID) | ORAL | Status: DC | PRN
  Administered 2012-04-08 (×2): 100 mg via ORAL
  Administered 2012-04-09: 50 mg via ORAL
  Administered 2012-04-10: 100 mg via ORAL
  Filled 2012-04-08: qty 2
  Filled 2012-04-08: qty 1
  Filled 2012-04-08 (×2): qty 2

## 2012-04-08 MED ORDER — TRAMADOL HCL 50 MG PO TABS: 50.0000 mg | ORAL_TABLET | Freq: Four times a day (QID) | ORAL | Status: DC | PRN

## 2012-04-08 MED ORDER — MORPHINE SULFATE 2 MG/ML IJ SOLN
2.0000 mg | INTRAMUSCULAR | Status: DC | PRN
  Administered 2012-04-08: 2 mg via INTRAVENOUS
  Filled 2012-04-08: qty 1

## 2012-04-08 MED ORDER — BISACODYL 10 MG RE SUPP
10.0000 mg | Freq: Once | RECTAL | Status: AC
  Administered 2012-04-08: 10 mg via RECTAL
  Filled 2012-04-08: qty 1

## 2012-04-08 MED ORDER — TRAMADOL HCL 50 MG PO TABS: 100.0000 mg | ORAL_TABLET | Freq: Four times a day (QID) | ORAL | Status: DC | PRN

## 2012-04-08 MED ORDER — DSS 100 MG PO CAPS: 100.0000 mg | ORAL_CAPSULE | Freq: Two times a day (BID) | ORAL | Status: DC

## 2012-04-08 MED ORDER — DOCUSATE SODIUM 100 MG PO CAPS: 100.0000 mg | ORAL_CAPSULE | Freq: Two times a day (BID) | ORAL | Status: DC

## 2012-04-08 MED FILL — Diphenhydramine HCl Inj 50 MG/ML: INTRAMUSCULAR | Qty: 1 | Status: AC

## 2012-04-08 NOTE — Progress Notes (Signed)
Patient ID: Craig Duke, male   DOB: 10-27-1955, 56 y.o.   MRN: [id number]  1 Day Post-Op Subjective: The patient is doing well.  No nausea or vomiting. Pain is adequately controlled.  Objective: Vital signs in last 24 hours: Temp:  [97.2 F (36.2 C)-98.4 F (36.9 C)] 97.8 F (36.6 C) (09/20 0417) Pulse Rate:  [45-75] 45  (09/20 0417) Resp:  [11-18] 18  (09/20 0417) BP: (100-134)/(48-77) 108/63 mmHg (09/20 0417) SpO2:  [97 %-100 %] 100 % (09/20 0417) Weight:  [80.287 kg (177 lb)] 80.287 kg (177 lb) (09/19 1157)  Intake/Output from previous day: 09/19 0701 - 09/20 0700 In: 4263.5 [P.O.:361; I.V.:3702.5; IV Piggyback:200] Out: 3720 [Urine:3350; Drains:170; Blood:125] Intake/Output this shift:    Physical Exam:  General: Alert and oriented. CV: RRR Lungs: Clear bilaterally. GI: Soft, Nondistended. Incisions: Clean and dry. Urine: Clear Extremities: Nontender, no erythema, no edema.  Lab Results:  Basename 04/08/12 0425 04/07/12 1042 04/05/12 1130  HGB 14.5 14.0 15.8  HCT 43.0 41.5 47.0          Basename 04/08/12 0425 04/07/12 1042 04/05/12 1130  CREATININE 1.30 1.11 1.02           Results for orders placed during the hospital encounter of 04/07/12 (from the past 24 hour(s))  BASIC METABOLIC PANEL     Status: Abnormal   Collection Time   04/07/12 10:42 AM      Component Value Range   Sodium 133 (*) 135 - 145 mEq/L   Potassium 4.0  3.5 - 5.1 mEq/L   Chloride 100  96 - 112 mEq/L   CO2 26  19 - 32 mEq/L   Glucose, Bld 126 (*) 70 - 99 mg/dL   BUN 9  6 - 23 mg/dL   Creatinine, Ser 1.47  0.50 - 1.35 mg/dL   Calcium 8.6  8.4 - 82.9 mg/dL   GFR calc non Af Amer 71 (*) >90 mL/min   GFR calc Af Amer 83 (*) >90 mL/min  HEMOGLOBIN AND HEMATOCRIT, BLOOD     Status: Normal   Collection Time   04/07/12 10:42 AM      Component Value Range   Hemoglobin 14.0  13.0 - 17.0 g/dL   HCT 56.2  13.0 - 86.5 %  BASIC METABOLIC PANEL     Status: Abnormal   Collection Time   04/08/12  4:25 AM      Component Value Range   Sodium 138  135 - 145 mEq/L   Potassium 4.3  3.5 - 5.1 mEq/L   Chloride 103  96 - 112 mEq/L   CO2 27  19 - 32 mEq/L   Glucose, Bld 126 (*) 70 - 99 mg/dL   BUN 11  6 - 23 mg/dL   Creatinine, Ser 7.84  0.50 - 1.35 mg/dL   Calcium 9.1  8.4 - 69.6 mg/dL   GFR calc non Af Amer 59 (*) >90 mL/min   GFR calc Af Amer 68 (*) >90 mL/min  HEMOGLOBIN AND HEMATOCRIT, BLOOD     Status: Normal   Collection Time   04/08/12  4:25 AM      Component Value Range   Hemoglobin 14.5  13.0 - 17.0 g/dL   HCT 29.5  28.4 - 13.2 %    Assessment/Plan: POD# 1 s/p robotic partial nephrectomy.  1) Ambulate, Incentive spirometry 2) Advance diet as tolerated 3) Transition to oral pain medication 4) Dulcolax suppository 5) D/C urethral catheter   Moody Bruins. MD  LOS: 1 day   Jawaan Adachi,LES 04/08/2012, 7:20 AM

## 2012-04-08 NOTE — Progress Notes (Signed)
Pt doing well.  Passing flatus but somewhat bloated. Tramadol has helped pain and he is tolerating it well.  Pathology: Pending  Will continue to ambulate and reassess in morning.  If JP Cr is ok, will D/C drain and discharge home if otherwise ready.

## 2012-04-09 LAB — BASIC METABOLIC PANEL
CO2: 26 mEq/L (ref 19–32)
Calcium: 9.1 mg/dL (ref 8.4–10.5)
Chloride: 99 mEq/L (ref 96–112)
Creatinine, Ser: 1.27 mg/dL (ref 0.50–1.35)
Glucose, Bld: 121 mg/dL — ABNORMAL HIGH (ref 70–99)

## 2012-04-09 LAB — CREATININE, FLUID (PLEURAL, PERITONEAL, JP DRAINAGE): Creat, Fluid: 1.2 mg/dL

## 2012-04-09 MED ORDER — PROMETHAZINE HCL 25 MG/ML IJ SOLN
25.0000 mg | Freq: Four times a day (QID) | INTRAMUSCULAR | Status: DC | PRN
  Administered 2012-04-09: 25 mg via INTRAVENOUS
  Filled 2012-04-09: qty 1

## 2012-04-09 MED ORDER — HYDROMORPHONE HCL PF 1 MG/ML IJ SOLN
0.5000 mg | INTRAMUSCULAR | Status: DC | PRN
  Administered 2012-04-09: 0.5 mg via INTRAVENOUS
  Filled 2012-04-09: qty 1

## 2012-04-09 NOTE — Progress Notes (Signed)
1610 Patient has had a lot of flatus this am after ambulating @ 0500.  Patient did not want to take Tramadol. Patient was nauseated and had an episode of emesis.  Patient received Zofran 4mg  IV twice this past night.

## 2012-04-09 NOTE — Progress Notes (Addendum)
2 Days Post-Op  Subjective: 1- Left Renal Mass - POD 2 s/p left robotic parital nephretomy for complex cystic mass. Pt with sig nausea overnight with dry heaves. Pain controlled. BMP wnl this AM, Drain Cr pending.   Objective: Vital signs in last 24 hours: Temp:  [97.8 F (36.6 C)-98.8 F (37.1 C)] 98.2 F (36.8 C) (09/21 0440) Pulse Rate:  [59-81] 71  (09/21 0440) Resp:  [18] 18  (09/21 0440) BP: (116-121)/(63-73) 119/72 mmHg (09/21 0440) SpO2:  [90 %-99 %] 90 % (09/21 0440) Last BM Date: 04/06/12  Intake/Output from previous day: 09/20 0701 - 09/21 0700 In: 3670 [P.O.:840; I.V.:2830] Out: 1045 [Urine:900; Drains:145] Intake/Output this shift:    General appearance: alert, cooperative and appears stated age Head: Normocephalic, without obvious abnormality, atraumatic Eyes: conjunctivae/corneas clear. PERRL, EOM's intact. Fundi benign. Ears: normal TM's and external ear canals both ears Nose: Nares normal. Septum midline. Mucosa normal. No drainage or sinus tenderness. Resp: clear to auscultation bilaterally Chest wall: no tenderness Cardio: regular rate and rhythm, S1, S2 normal, no murmur, click, rub or gallop GI: soft, non-tender; bowel sounds normal; no masses,  no organomegaly Male genitalia: normal Extremities: extremities normal, atraumatic, no cyanosis or edema Pulses: 2+ and symmetric Skin: Skin color, texture, turgor normal. No rashes or lesions Neurologic: Grossly normal Incision/Wound: Port sites c/d/i. JP drain with expected serous output.  Lab Results:   Basename 04/08/12 0425 04/07/12 1042  WBC -- --  HGB 14.5 14.0  HCT 43.0 41.5  PLT -- --   BMET  Basename 04/09/12 0540 04/08/12 0425  NA 135 138  K 3.8 4.3  CL 99 103  CO2 26 27  GLUCOSE 121* 126*  BUN 11 11  CREATININE 1.27 1.30  CALCIUM 9.1 9.1   PT/INR No results found for this basename: LABPROT:2,INR:2 in the last 72 hours ABG No results found for this basename:  PHART:2,PCO2:2,PO2:2,HCO3:2 in the last 72 hours  Studies/Results: No results found.  Anti-infectives: Anti-infectives     Start     Dose/Rate Route Frequency Ordered Stop   04/07/12 1900   vancomycin (VANCOCIN) IVPB 1000 mg/200 mL premix        1,000 mg 200 mL/hr over 60 Minutes Intravenous Every 12 hours 04/07/12 1208 04/07/12 2325   04/07/12 0517   vancomycin (VANCOCIN) IVPB 1000 mg/200 mL premix        1,000 mg 200 mL/hr over 60 Minutes Intravenous 60 min pre-op 04/07/12 0517 04/07/12 0700          Assessment/Plan:  1- Left Renal Mass - f/u drain Cr, possibly remove JP later today. Change nausea meds to phenergan, pain meds to dilaudid to help with nausea. Remain in house with likely DC in AM tomorrow.   LOS: 2 days    [facility], Emmie Frakes 04/09/2012

## 2012-04-10 MED ORDER — PROMETHAZINE HCL 12.5 MG PO TABS: 12.5000 mg | ORAL_TABLET | Freq: Four times a day (QID) | ORAL | Status: DC | PRN

## 2012-04-10 MED ORDER — SENNA-DOCUSATE SODIUM 8.6-50 MG PO TABS: 1.0000 | ORAL_TABLET | Freq: Two times a day (BID) | ORAL | Status: DC

## 2012-04-10 MED ORDER — HYDROMORPHONE HCL 2 MG PO TABS: 2.0000 mg | ORAL_TABLET | Freq: Four times a day (QID) | ORAL | Status: DC | PRN

## 2012-04-10 MED ORDER — SULFAMETHOXAZOLE-TRIMETHOPRIM 800-160 MG PO TABS: 1.0000 | ORAL_TABLET | Freq: Two times a day (BID) | ORAL | Status: DC

## 2012-04-10 NOTE — Progress Notes (Signed)
Discharge information reviewed with pt and wife. No questions. Both verbalized understanding of information, medications, f/u appointments, and incision care. JP drain and IV d/c. Monitored for 45 min post. Pt escorted to personal vehicle via w/c. IS taken home for continous use and additional gauze and tape given for jp site care.

## 2012-04-10 NOTE — Discharge Summary (Signed)
Physician Discharge Summary  Patient ID: Craig Duke MRN: [id number] DOB/AGE: [birth year] 56 y.o.  Admit date: 04/07/2012 Discharge date: 04/10/2012  Admission Diagnoses: Left Renal Mass  Discharge Diagnoses: Left Renal Mass Active Problems:  * No active hospital problems. *    Discharged Condition: good  Hospital Course:   Pt underwent left robotic partial nephrectomy on 04/07/2012 without acute complications. He was admitted to the 4th floor Urology service where he began his vigorous recovery. He is highly sensitive to many pain meds and a regimen of dilaudid for pain and Phenergan for nausea proved effective. His JP drain output trended down and creatinine was consistent with serum.  By 9/22, the day of discharge, he was ambulating, tolerating a regular diet, pain control, and felt to be adequate for discharge.   Consults: None  Significant Diagnostic Studies: labs: JP Cr - 1.2  Treatments: surgery: left robotic partial nephrectomy on 04/07/2012  Discharge Exam: Blood pressure 117/73, pulse 66, temperature 99.2 F (37.3 C), temperature source Oral, resp. rate 16, height 5\' 7"  (1.702 m), weight 80.287 kg (177 lb), SpO2 96.00%. General appearance: alert, cooperative and appears stated age Head: Normocephalic, without obvious abnormality, atraumatic Eyes: conjunctivae/corneas clear. PERRL, EOM's intact. Fundi benign. Nose: Nares normal. Septum midline. Mucosa normal. No drainage or sinus tenderness. Throat: lips, mucosa, and tongue normal; teeth and gums normal Back: symmetric, no curvature. ROM normal. No CVA tenderness. Resp: clear to auscultation bilaterally Breasts: normal appearance, no masses or tenderness Cardio: regular rate and rhythm, S1, S2 normal, no murmur, click, rub or gallop GI: soft, non-tender; bowel sounds normal; no masses,  no organomegaly Male genitalia: normal Extremities: extremities normal, atraumatic, no cyanosis or edema Pulses: 2+ and  symmetric Skin: Skin color, texture, turgor normal. No rashes or lesions Lymph nodes: Cervical, supraclavicular, and axillary nodes normal. Neurologic: Grossly normal Incision/Wound: JP site c/d/i, drain removed. Port sites c/d/i, mild blanchig erythema at prior camera port site w/o drainage or tenderness.  Disposition:      Medication List     As of 04/10/2012  6:52 AM    STOP taking these medications         aspirin EC 81 MG tablet      ibuprofen 800 MG tablet   Commonly known as: ADVIL,MOTRIN      TAKE these medications         DSS 100 MG Caps   Take 100 mg by mouth 2 (two) times daily.      HYDROmorphone 2 MG tablet   Commonly known as: DILAUDID   Take 1 tablet (2 mg total) by mouth every 6 (six) hours as needed for pain.      promethazine 12.5 MG tablet   Commonly known as: PHENERGAN   Take 1 tablet (12.5 mg total) by mouth every 6 (six) hours as needed for nausea.      rosuvastatin 20 MG tablet   Commonly known as: CRESTOR   Take 20 mg by mouth at bedtime.      sennosides-docusate sodium 8.6-50 MG tablet   Commonly known as: SENOKOT-S   Take 1 tablet by mouth 2 (two) times daily.      sulfamethoxazole-trimethoprim 800-160 MG per tablet   Commonly known as: BACTRIM DS,SEPTRA DS   Take 1 tablet by mouth 2 (two) times daily. To prevent infection      traMADol 50 MG tablet   Commonly known as: ULTRAM   Take 1-2 tablets (50-100 mg total) by mouth every 6 (six) hours  as needed (50mg  for moderate pain; 100mg  for severe pain.).           Follow-up Information    Follow up with BORDEN,LES, MD. On 05/03/2012. (at 10:00)    Contact information:   [address] [place] [address] [phone]          Signed: Sebastian Ache 04/10/2012, 6:52 AM

## 2012-09-19 ENCOUNTER — Ambulatory Visit (INDEPENDENT_AMBULATORY_CARE_PROVIDER_SITE_OTHER): Payer: BC Managed Care – PPO | Admitting: Cardiology

## 2012-09-19 ENCOUNTER — Encounter: Payer: Self-pay | Admitting: Cardiology

## 2012-09-19 VITALS — BP 118/68 | HR 64 | Ht 68.0 in | Wt 179.8 lb

## 2012-09-19 DIAGNOSIS — I059 Rheumatic mitral valve disease, unspecified: Secondary | ICD-10-CM

## 2012-09-19 NOTE — Patient Instructions (Addendum)
Your physician recommends that you continue on your current medications as directed. Please refer to the Current Medication list given to you today.  Your physician has requested that you have an echocardiogram. Echocardiography is a painless test that uses sound waves to create images of your heart. It provides your doctor with information about the size and shape of your heart and how well your heart's chambers and valves are working. This procedure takes approximately one hour. There are no restrictions for this procedure.   Your physician wants you to follow-up in: 1 year You will receive a reminder letter in the mail two months in advance. If you don't receive a letter, please call our office to schedule the follow-up appointment.   

## 2012-09-19 NOTE — Progress Notes (Signed)
   HPI The patient presents for followup of mitral valve repair. Since I last saw him he had robotic assisted resection of a renal cell carcinoma. He has done well with this. Of note preoperatively an echocardiogram demonstrated stable mitral valve repair with a mildly reduced ejection fraction which was new..  The patient denies any new symptoms such as chest discomfort, neck or arm discomfort. There has been no new shortness of breath, PND or orthopnea. There have been no reported palpitations, presyncope or syncope.   He is active though he doesn't exercise routinely but is starting to do more of this.  Allergies  Allergen Reactions  . Hydrocodone Nausea And Vomiting  . Oxycodone     Itching and nausea   . Penicillins Swelling    Current Outpatient Prescriptions  Medication Sig Dispense Refill  . rosuvastatin (CRESTOR) 20 MG tablet Take 20 mg by mouth at bedtime.        No current facility-administered medications for this visit.    Past Medical History  Diagnosis Date  . MVP (mitral valve prolapse)   . Dyslipidemia   . Depression     Resolved off medications  . Migraines   . IBS (irritable bowel syndrome)   . PONV (postoperative nausea and vomiting)   . GERD (gastroesophageal reflux disease)   . Chronic kidney disease     left renal neoplasm    Past Surgical History  Procedure Laterality Date  . Vasectomy    . Anal fissure repair      anal sphincterectomy  . Mitral valve repair  2006  . Sphyncterotomy  2004  . Knee surgery  2008  . Inguinal hernia repair  2010    Bilateral    ROS:  As stated in the HPI and negative for all other systems.   PHYSICAL EXAM BP 118/68  Pulse 64  Ht 5\' 8"  (1.727 m)  Wt 179 lb 12.8 oz (81.557 kg)  BMI 27.34 kg/m2 GENERAL:  Well appearing NECK:  No jugular venous distention, waveform within normal limits, carotid upstroke brisk and symmetric, no bruits, no thyromegaly LUNGS:  Clear to auscultation bilaterally CHEST:   Unremarkable HEART:  PMI not displaced or sustained,S1 and S2 within normal limits, no S3, no S4, no clicks, no rubs, no murmur, well healed surgical scar  ABD:  Flat, positive bowel sounds normal in frequency in pitch, no bruits, no rebound, no guarding, no midline pulsatile mass, no hepatomegaly, no splenomegaly EXT:  2 plus pulses throughout, no edema, no cyanosis no clubbing   EKG:  Sinus rhythm, rate 64, axis within normal limits, intervals within normal limits, no acute ST-T wave changes.  09/19/2012   ASSESSMENT AND PLAN   Mitral Valve Repair - He had some very mild aortic stenosis previously but his ejection fraction in August was mildly lower than previous at 45-50%. I will repeat an echo now.  If stable I will plan one year follow up.  Cardiomyopathy - As above.

## 2012-09-27 ENCOUNTER — Ambulatory Visit (HOSPITAL_COMMUNITY): Payer: BC Managed Care – PPO | Attending: Internal Medicine

## 2012-09-27 DIAGNOSIS — I059 Rheumatic mitral valve disease, unspecified: Secondary | ICD-10-CM

## 2012-09-27 NOTE — Progress Notes (Signed)
Echocardiogram performed.  

## 2012-11-02 ENCOUNTER — Ambulatory Visit (HOSPITAL_COMMUNITY)
Admission: RE | Admit: 2012-11-02 | Discharge: 2012-11-02 | Disposition: A | Discharge status: Home / Self Care | Payer: BC Managed Care – PPO | Source: Ambulatory Visit | Attending: Urology | Admitting: Urology

## 2012-11-02 ENCOUNTER — Other Ambulatory Visit (HOSPITAL_COMMUNITY): Payer: Self-pay | Admitting: Urology

## 2012-11-02 DIAGNOSIS — C649 Malignant neoplasm of unspecified kidney, except renal pelvis: Secondary | ICD-10-CM

## 2012-11-02 DIAGNOSIS — Z85528 Personal history of other malignant neoplasm of kidney: Secondary | ICD-10-CM | POA: Insufficient documentation

## 2013-04-26 ENCOUNTER — Other Ambulatory Visit (HOSPITAL_COMMUNITY): Payer: Self-pay | Admitting: Urology

## 2013-04-26 ENCOUNTER — Ambulatory Visit (HOSPITAL_COMMUNITY)
Admission: RE | Admit: 2013-04-26 | Discharge: 2013-04-26 | Disposition: A | Discharge status: Home / Self Care | Payer: BC Managed Care – PPO | Source: Ambulatory Visit | Attending: Urology | Admitting: Urology

## 2013-04-26 DIAGNOSIS — C649 Malignant neoplasm of unspecified kidney, except renal pelvis: Secondary | ICD-10-CM | POA: Insufficient documentation

## 2013-05-30 IMAGING — CR DG CHEST 2V
2 series · 2 of 2 positions shown · non-contrast
Comparison: 03/08/2012

CLINICAL DATA: Repeat chest x-ray with nipple markers, preop

CHEST - 2 VIEW

[w chest pa]
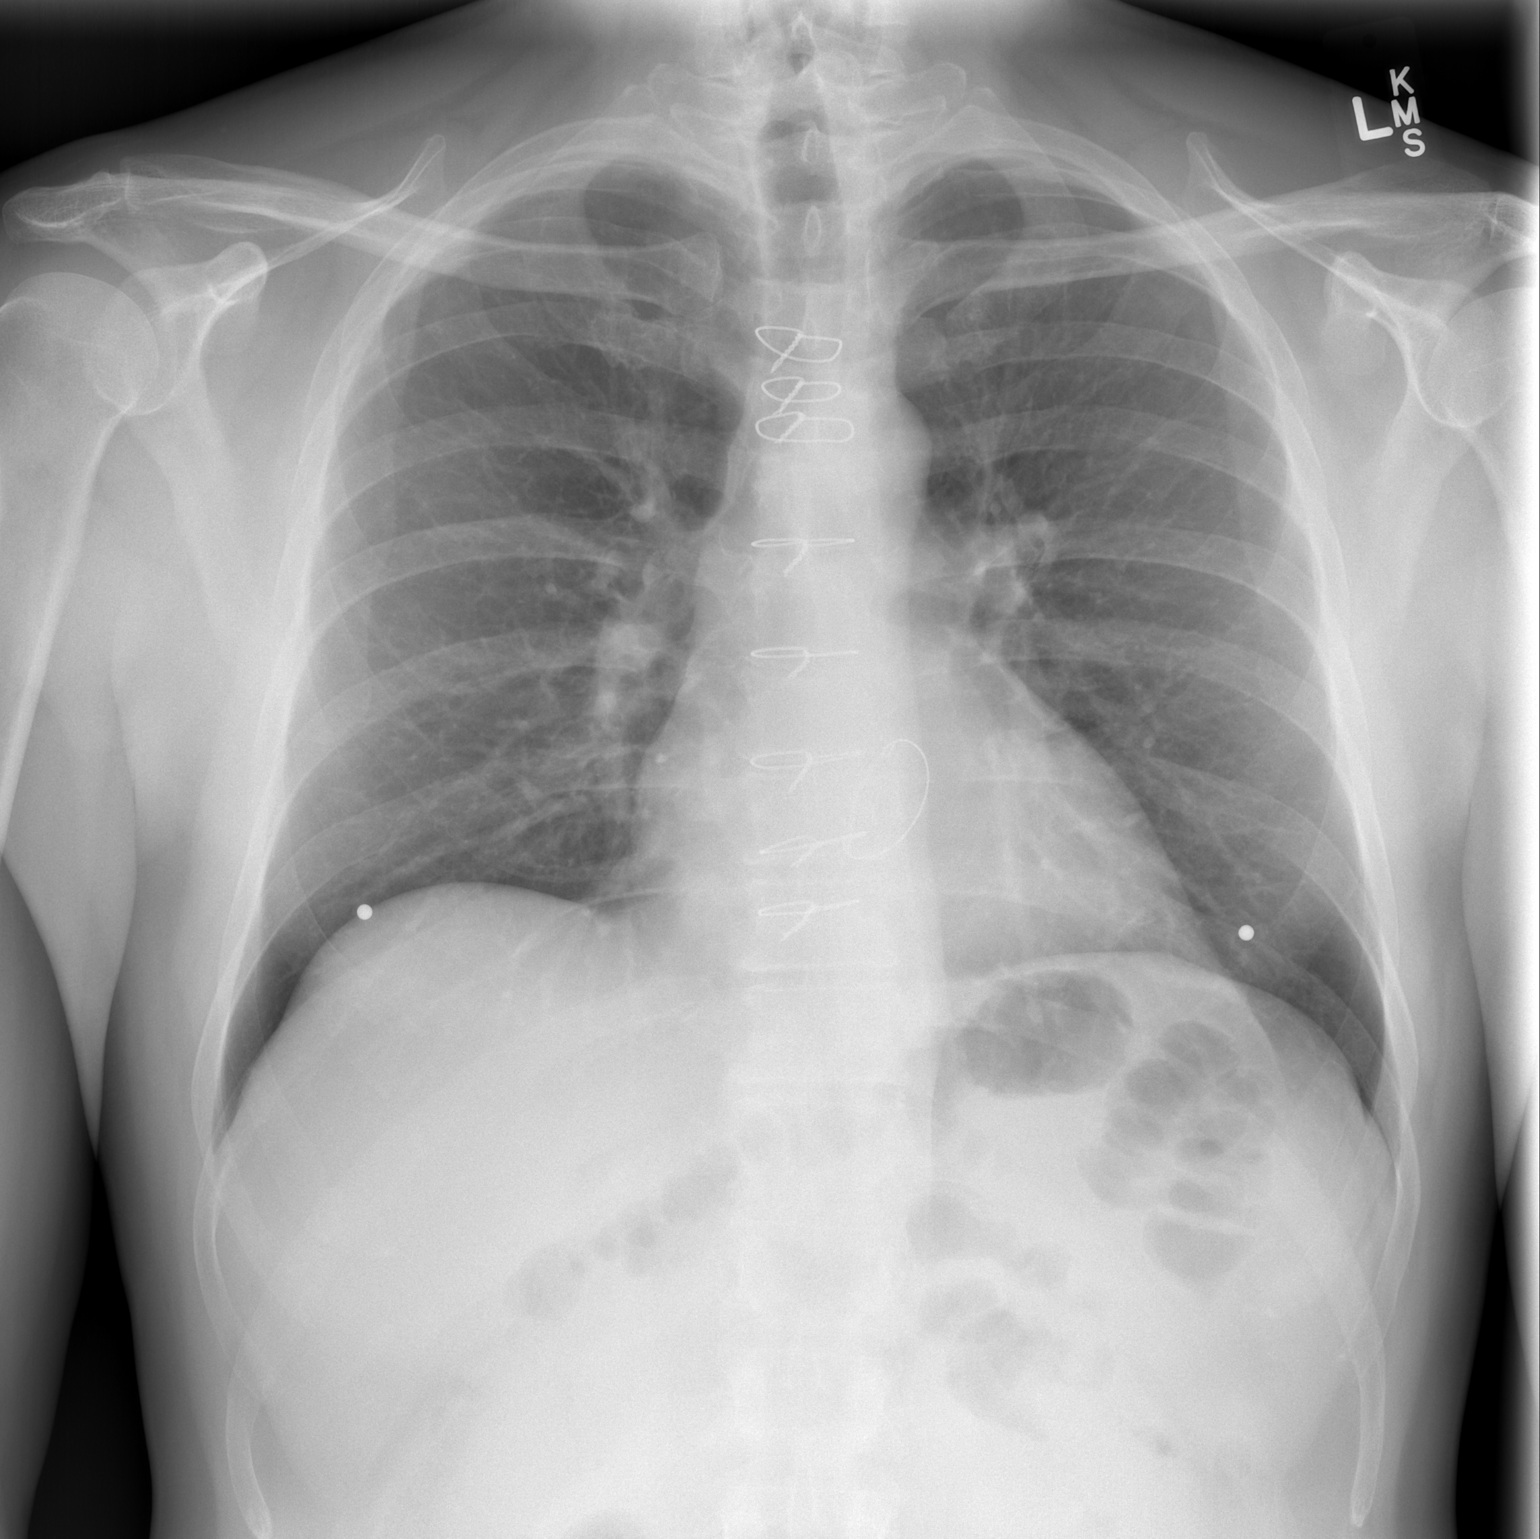

[w chest lat]
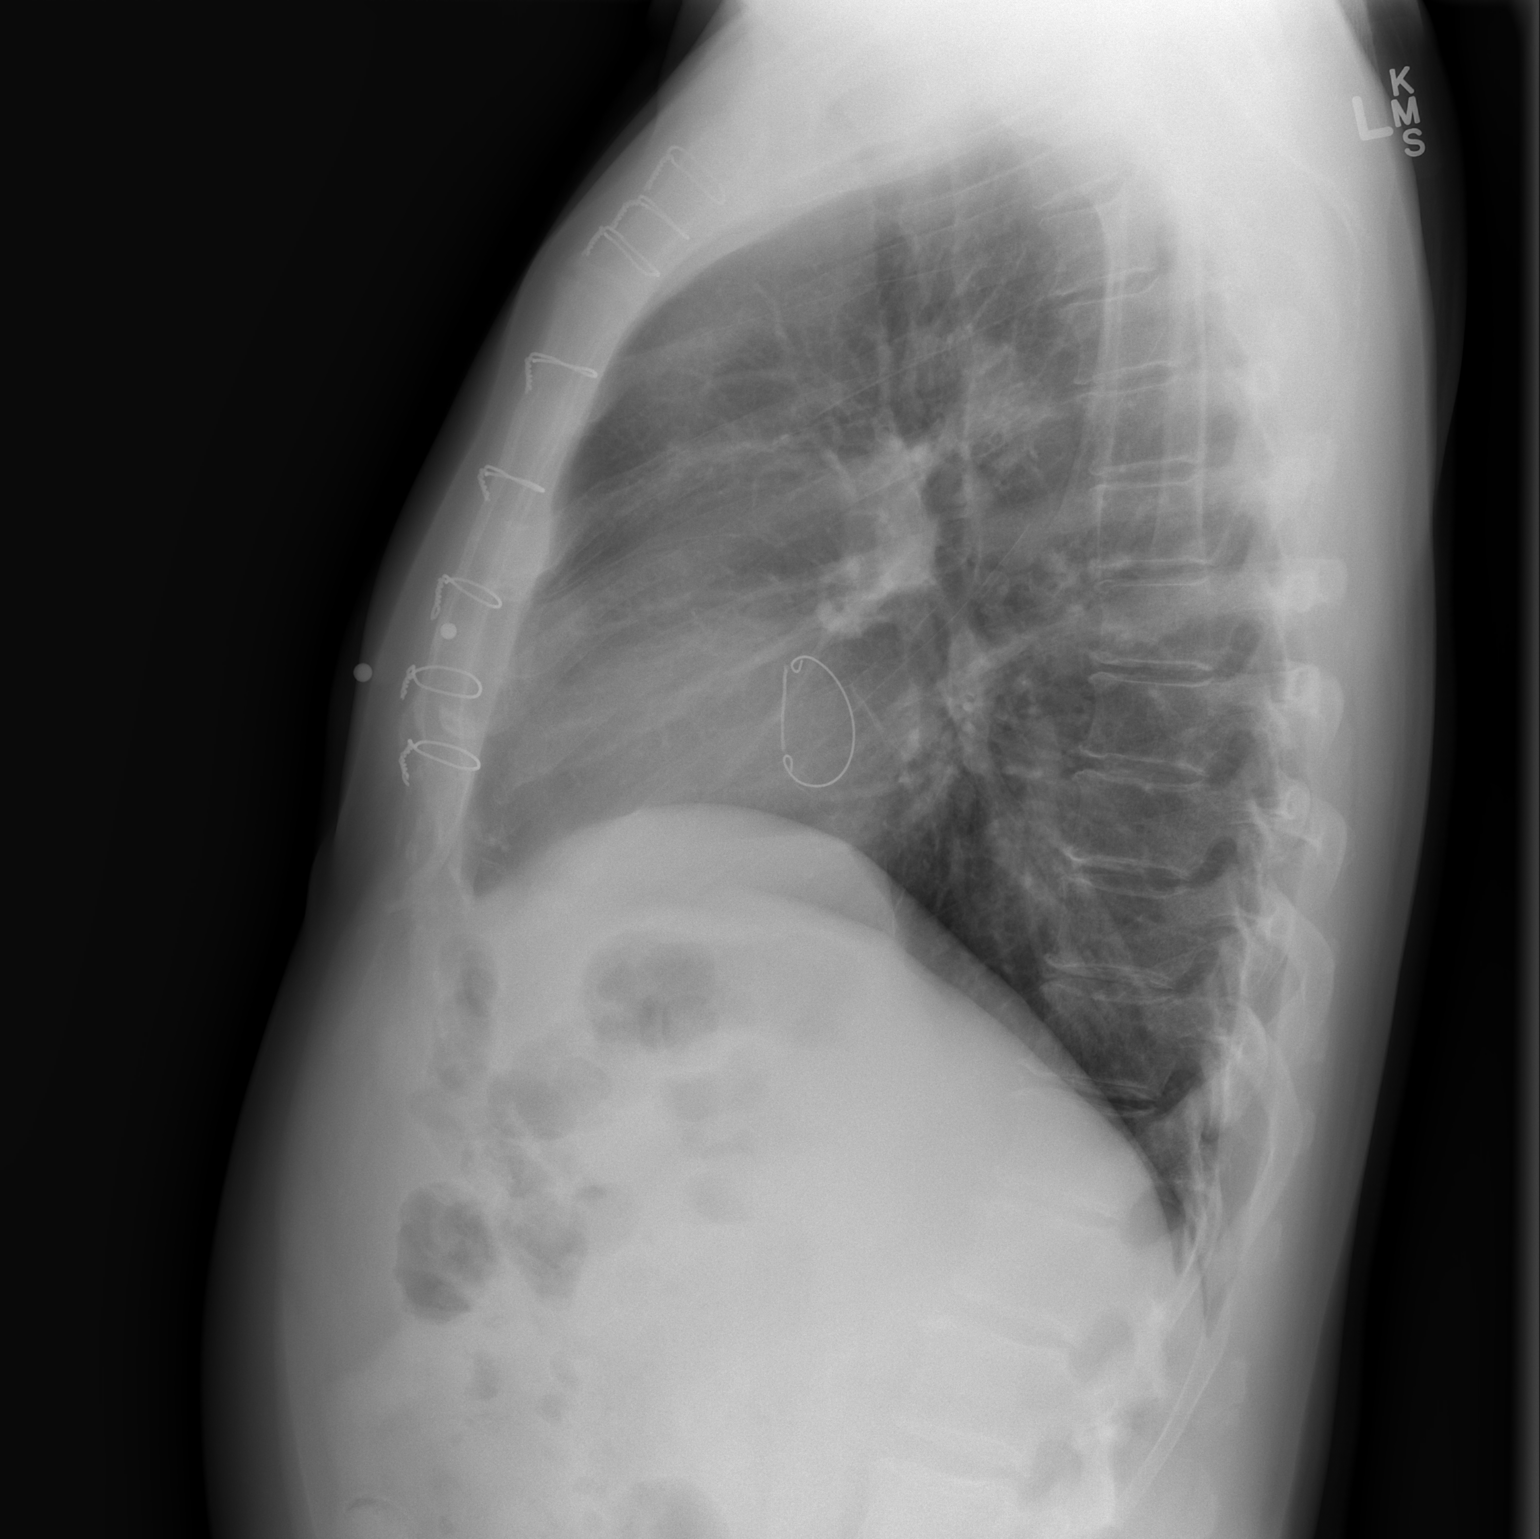

[2 of 2 positions shown; findings below may reference images not displayed]

FINDINGS: Cardiomediastinal silhouette is stable.  Status post
median sternotomy again noted.  Bilateral nipple markers noted.
Shadowing noted on previous study corresponds to nipple and
markers.  No lung nodule noted. No acute infiltrate or pulmonary
edema.  Mild degenerative changes thoracic spine.
IMPRESSION: No active disease.  No lung nodules are noted.  Status post median
sternotomy and mitral valve replacement..

## 2013-09-22 ENCOUNTER — Ambulatory Visit (INDEPENDENT_AMBULATORY_CARE_PROVIDER_SITE_OTHER): Payer: BC Managed Care – PPO | Admitting: Cardiology

## 2013-09-22 ENCOUNTER — Encounter: Payer: Self-pay | Admitting: Cardiology

## 2013-09-22 VITALS — BP 110/80 | HR 59 | Ht 68.0 in | Wt 179.0 lb

## 2013-09-22 DIAGNOSIS — I059 Rheumatic mitral valve disease, unspecified: Secondary | ICD-10-CM

## 2013-09-22 NOTE — Patient Instructions (Signed)
The current medical regimen is effective;  continue present plan and medications.  Follow up in 1 year with Dr Hochrein.  You will receive a letter in the mail 2 months before you are due.  Please call us when you receive this letter to schedule your follow up appointment.  

## 2013-09-22 NOTE — Progress Notes (Signed)
   HPI The patient presents for followup of mitral valve repair.   The patient denies any new symptoms such as chest discomfort, neck or arm discomfort. There has been no new shortness of breath, PND or orthopnea. There have been no reported palpitations, presyncope or syncope.   He is active though he doesn't exercise routinely.  He has stress at work.  He did have an echo last year with an EF of 50 - 55% and a stable MV repair with aortic valve thickening but no significant stenosis.    Allergies  Allergen Reactions  . Hydrocodone Nausea And Vomiting  . Oxycodone     Itching and nausea   . Penicillins Swelling    Current Outpatient Prescriptions  Medication Sig Dispense Refill  . aspirin 81 MG tablet Take 81 mg by mouth daily.      . citalopram (CELEXA) 10 MG tablet Take 10 mg by mouth daily.      . rosuvastatin (CRESTOR) 20 MG tablet Take 20 mg by mouth at bedtime.        No current facility-administered medications for this visit.    Past Medical History  Diagnosis Date  . MVP (mitral valve prolapse)   . Dyslipidemia   . Depression     Resolved off medications  . Migraines   . IBS (irritable bowel syndrome)   . PONV (postoperative nausea and vomiting)   . GERD (gastroesophageal reflux disease)   . Chronic kidney disease     Clear cell carcinoma    Past Surgical History  Procedure Laterality Date  . Vasectomy    . Anal fissure repair      anal sphincterectomy  . Mitral valve repair  2006  . Sphyncterotomy  2004  . Knee surgery  2008  . Inguinal hernia repair  2010    Bilateral    ROS:  As stated in the HPI and negative for all other systems.   PHYSICAL EXAM BP 110/80  Pulse 59  Ht 5\' 8"  (1.727 m)  Wt 179 lb (81.194 kg)  BMI 27.22 kg/m2 GENERAL:  Well appearing NECK:  No jugular venous distention, waveform within normal limits, carotid upstroke brisk and symmetric, no bruits, no thyromegaly LUNGS:  Clear to auscultation bilaterally CHEST:   Unremarkable HEART:  PMI not displaced or sustained,S1 and S2 within normal limits, no S3, no S4, no clicks, no rubs, no murmur, well healed surgical scar  ABD:  Flat, positive bowel sounds normal in frequency in pitch, no bruits, no rebound, no guarding, no midline pulsatile mass, no hepatomegaly, no splenomegaly EXT:  2 plus pulses throughout, no edema, no cyanosis no clubbing   EKG:  Sinus rhythm, rate 59, axis within normal limits, intervals within normal limits, no acute ST-T wave changes.  09/22/2013   ASSESSMENT AND PLAN  Mitral Valve Repair - His repair was fine last year with an EF of 50%.  No change in therapy is indicated.  No further imaging is indicated.    Cardiomyopathy - He has a very mildly reduced EF.  No change in therapy is indicated.

## 2013-10-30 ENCOUNTER — Encounter (INDEPENDENT_AMBULATORY_CARE_PROVIDER_SITE_OTHER): Payer: Self-pay | Admitting: General Surgery

## 2013-10-30 ENCOUNTER — Ambulatory Visit (INDEPENDENT_AMBULATORY_CARE_PROVIDER_SITE_OTHER): Payer: PRIVATE HEALTH INSURANCE | Admitting: General Surgery

## 2013-10-30 VITALS — BP 134/76 | HR 58 | Temp 96.8°F | Resp 14 | Ht 67.0 in | Wt 177.0 lb

## 2013-10-30 DIAGNOSIS — R1031 Right lower quadrant pain: Secondary | ICD-10-CM

## 2013-10-30 NOTE — Patient Instructions (Signed)
May return to normal activities 

## 2013-10-30 NOTE — Progress Notes (Signed)
Subjective:     Patient ID: Craig Duke, male   DOB: [birth year], 58 y.o.   MRN: [id number]  HPI The patient is a 58 year old white male who is about 5 years out from bilateral inguinal hernia repairs with mesh. He states that he intermittently we'll get some right groin discomfort. This occurs mostly after climbing on the lateral lifting some heavy objects. The most recent episode this occurred about 2 weeks ago after climbing a ladder all day. Since that time his pain has resolved. His appetite is good and his bowels are working normally.  Review of Systems  Constitutional: Negative.   HENT: Negative.   Eyes: Negative.   Respiratory: Negative.   Cardiovascular: Negative.   Gastrointestinal: Negative.   Endocrine: Negative.   Genitourinary: Negative.   Musculoskeletal: Negative.   Skin: Negative.   Allergic/Immunologic: Negative.   Neurological: Negative.   Hematological: Negative.   Psychiatric/Behavioral: Negative.        Objective:   Physical Exam  Constitutional: He is oriented to person, place, and time. He appears well-developed and well-nourished.  HENT:  Head: Normocephalic and atraumatic.  Eyes: Conjunctivae and EOM are normal. Pupils are equal, round, and reactive to light.  Neck: Normal range of motion. Neck supple.  Cardiovascular: Normal rate, regular rhythm and normal heart sounds.   Pulmonary/Chest: Effort normal and breath sounds normal.  Abdominal: Soft. Bowel sounds are normal.  Genitourinary:  There is no palpable bulge or impulse with straining in the right groin  Musculoskeletal: Normal range of motion.  Neurological: He is alert and oriented to person, place, and time.  Skin: Skin is warm and dry.  Psychiatric: He has a normal mood and affect. His behavior is normal.       Assessment:     The patient is intermittently having some recurrent discomfort in the right groin. Clinically I do not feel any evidence of recurrence of his hernia. I suspect that  his pain is related to his scar. I have offered to get a CT scan of the pelvis to look for any radiographic evidence of a recurrent hernia but he does not want to have this done at this time     Plan:     At this point he will return to his normal activities. He will continue to try to protect his right groin. We will plan to see him back on a when necessary basis

## 2013-12-06 ENCOUNTER — Ambulatory Visit (HOSPITAL_COMMUNITY)
Admission: RE | Admit: 2013-12-06 | Discharge: 2013-12-06 | Disposition: A | Discharge status: Home / Self Care | Payer: BC Managed Care – PPO | Source: Ambulatory Visit | Attending: Urology | Admitting: Urology

## 2013-12-06 ENCOUNTER — Other Ambulatory Visit (HOSPITAL_COMMUNITY): Payer: Self-pay | Admitting: Urology

## 2013-12-06 DIAGNOSIS — C649 Malignant neoplasm of unspecified kidney, except renal pelvis: Secondary | ICD-10-CM | POA: Insufficient documentation

## 2014-04-10 ENCOUNTER — Ambulatory Visit: Payer: BC Managed Care – PPO | Admitting: Cardiology

## 2014-04-12 ENCOUNTER — Ambulatory Visit (INDEPENDENT_AMBULATORY_CARE_PROVIDER_SITE_OTHER): Payer: BC Managed Care – PPO | Admitting: Cardiology

## 2014-04-12 ENCOUNTER — Encounter: Payer: Self-pay | Admitting: Cardiology

## 2014-04-12 VITALS — BP 96/76 | HR 62 | Ht 67.0 in | Wt 178.2 lb

## 2014-04-12 DIAGNOSIS — F3289 Other specified depressive episodes: Secondary | ICD-10-CM

## 2014-04-12 DIAGNOSIS — F329 Major depressive disorder, single episode, unspecified: Secondary | ICD-10-CM

## 2014-04-12 DIAGNOSIS — I059 Rheumatic mitral valve disease, unspecified: Secondary | ICD-10-CM

## 2014-04-12 DIAGNOSIS — E785 Hyperlipidemia, unspecified: Secondary | ICD-10-CM

## 2014-04-12 NOTE — Patient Instructions (Signed)
Your physician recommends that you schedule a follow-up appointment in: one year with Dr. Percival Spanish  We are ordering an Echo for March of 2016

## 2014-04-12 NOTE — Progress Notes (Signed)
   HPI The patient presents for followup of mitral valve repair.   The patient denies any new symptoms such as chest discomfort, neck or arm discomfort. There has been no new shortness of breath, PND or orthopnea. There have been no reported palpitations, presyncope or syncope.   He is active and did some walking in the woods not long ago for several miles and did OK with this.Marland Kitchen  He did have an echo last year with an EF of 50 - 55% and a stable MV repair with aortic valve thickening but no significant stenosis.    Allergies  Allergen Reactions  . Hydrocodone Nausea And Vomiting  . Oxycodone     Itching and nausea   . Penicillins Swelling    Current Outpatient Prescriptions  Medication Sig Dispense Refill  . aspirin 81 MG tablet Take 81 mg by mouth daily.      . citalopram (CELEXA) 10 MG tablet Take 10 mg by mouth daily.      . rosuvastatin (CRESTOR) 20 MG tablet Take 20 mg by mouth at bedtime.        No current facility-administered medications for this visit.    Past Medical History  Diagnosis Date  . MVP (mitral valve prolapse)   . Dyslipidemia   . Depression     Resolved off medications  . Migraines   . IBS (irritable bowel syndrome)   . PONV (postoperative nausea and vomiting)   . GERD (gastroesophageal reflux disease)   . Chronic kidney disease     Clear cell carcinoma    Past Surgical History  Procedure Laterality Date  . Vasectomy    . Anal fissure repair      anal sphincterectomy  . Mitral valve repair  2006  . Sphyncterotomy  2004  . Knee surgery  2008  . Inguinal hernia repair  2010    Bilateral    ROS:  As stated in the HPI and negative for all other systems.   PHYSICAL EXAM BP 96/76  Pulse 62  Ht 5\' 7"  (1.702 m)  Wt 178 lb 3.2 oz (80.831 kg)  BMI 27.90 kg/m2 GENERAL:  Well appearing NECK:  No jugular venous distention, waveform within normal limits, carotid upstroke brisk and symmetric, no bruits, no thyromegaly LUNGS:  Clear to auscultation  bilaterally CHEST:  Unremarkable HEART:  PMI not displaced or sustained,S1 and S2 within normal limits, no S3, no S4, no clicks, no rubs, no murmur, well healed surgical scar  ABD:  Flat, positive bowel sounds normal in frequency in pitch, no bruits, no rebound, no guarding, no midline pulsatile mass, no hepatomegaly, no splenomegaly EXT:  2 plus pulses throughout, no edema, no cyanosis no clubbing   EKG:  Sinus rhythm, rate 62, axis within normal limits, intervals within normal limits, no acute ST-T wave changes.  04/12/2014   ASSESSMENT AND PLAN  Mitral Valve Repair - His repair was fine last year with an EF of 50 - 55%.  No change in therapy is indicated.  I will repeat an echo in March of next year.   Cardiomyopathy - EF is low normal.  No change in therapy is planned.

## 2014-09-26 ENCOUNTER — Telehealth (HOSPITAL_COMMUNITY): Payer: Self-pay | Admitting: *Deleted

## 2014-10-29 ENCOUNTER — Ambulatory Visit (HOSPITAL_COMMUNITY)
Admission: RE | Admit: 2014-10-29 | Discharge: 2014-10-29 | Disposition: A | Discharge status: Home / Self Care | Payer: BLUE CROSS/BLUE SHIELD | Source: Ambulatory Visit | Attending: Specialist | Admitting: Specialist

## 2014-10-29 ENCOUNTER — Other Ambulatory Visit (HOSPITAL_COMMUNITY): Payer: Self-pay | Admitting: Specialist

## 2014-10-29 DIAGNOSIS — Z01818 Encounter for other preprocedural examination: Secondary | ICD-10-CM | POA: Diagnosis not present

## 2014-10-29 DIAGNOSIS — M25511 Pain in right shoulder: Secondary | ICD-10-CM

## 2015-03-01 ENCOUNTER — Ambulatory Visit (INDEPENDENT_AMBULATORY_CARE_PROVIDER_SITE_OTHER): Payer: BLUE CROSS/BLUE SHIELD | Admitting: Cardiology

## 2015-03-01 ENCOUNTER — Encounter: Payer: Self-pay | Admitting: Cardiology

## 2015-03-01 VITALS — BP 122/82 | HR 65

## 2015-03-01 DIAGNOSIS — I059 Rheumatic mitral valve disease, unspecified: Secondary | ICD-10-CM

## 2015-03-01 DIAGNOSIS — Z9889 Other specified postprocedural states: Secondary | ICD-10-CM

## 2015-03-01 NOTE — Patient Instructions (Signed)

## 2015-03-01 NOTE — Progress Notes (Signed)
   HPI The patient presents for followup of mitral valve repair.   The patient denies any new symptoms such as chest discomfort, neck or arm discomfort. There has been no new shortness of breath, PND or orthopnea. There have been no reported palpitations, presyncope or syncope.   He is active working and fishing.    Allergies  Allergen Reactions  . Hydrocodone Nausea And Vomiting  . Oxycodone     Itching and nausea   . Penicillins Swelling    Current Outpatient Prescriptions  Medication Sig Dispense Refill  . aspirin 81 MG tablet Take 81 mg by mouth daily.    . citalopram (CELEXA) 10 MG tablet Take 10 mg by mouth daily.    . rosuvastatin (CRESTOR) 20 MG tablet Take 20 mg by mouth at bedtime.      No current facility-administered medications for this visit.    Past Medical History  Diagnosis Date  . MVP (mitral valve prolapse)   . Dyslipidemia   . Depression     Resolved off medications  . Migraines   . IBS (irritable bowel syndrome)   . PONV (postoperative nausea and vomiting)   . GERD (gastroesophageal reflux disease)   . Chronic kidney disease     Clear cell carcinoma    Past Surgical History  Procedure Laterality Date  . Vasectomy    . Anal fissure repair      anal sphincterectomy  . Mitral valve repair  2006  . Sphyncterotomy  2004  . Knee surgery  2008  . Inguinal hernia repair  2010    Bilateral    ROS:  As stated in the HPI and negative for all other systems.   PHYSICAL EXAM There were no vitals taken for this visit. GENERAL:  Well appearing NECK:  No jugular venous distention, waveform within normal limits, carotid upstroke brisk and symmetric, no bruits, no thyromegaly LUNGS:  Clear to auscultation bilaterally CHEST:  Unremarkable HEART:  PMI not displaced or sustained,S1 and S2 within normal limits, no S3, no S4, no clicks, no rubs, no murmur, well healed surgical scar  ABD:  Flat, positive bowel sounds normal in frequency in pitch, no bruits, no  rebound, no guarding, no midline pulsatile mass, no hepatomegaly, no splenomegaly EXT:  2 plus pulses throughout, no edema, no cyanosis no clubbing   EKG:  Sinus rhythm, rate 65, axis within normal limits, intervals within normal limits, no acute ST-T wave changes, V1 and V2  no change from previous.  03/01/2015   ASSESSMENT AND PLAN  Mitral Valve Repair - His repair was fine last year with an EF of 50 - 55%.  No change in therapy is indicated.  I will repeat an echo.

## 2015-03-07 ENCOUNTER — Ambulatory Visit (HOSPITAL_COMMUNITY): Payer: BLUE CROSS/BLUE SHIELD | Attending: Cardiology

## 2015-03-07 ENCOUNTER — Other Ambulatory Visit: Payer: Self-pay

## 2015-03-07 DIAGNOSIS — Z9889 Other specified postprocedural states: Secondary | ICD-10-CM | POA: Diagnosis not present

## 2015-03-07 DIAGNOSIS — E785 Hyperlipidemia, unspecified: Secondary | ICD-10-CM | POA: Diagnosis not present

## 2015-03-07 DIAGNOSIS — I517 Cardiomegaly: Secondary | ICD-10-CM | POA: Diagnosis not present

## 2015-03-07 DIAGNOSIS — I351 Nonrheumatic aortic (valve) insufficiency: Secondary | ICD-10-CM | POA: Diagnosis not present

## 2015-03-07 DIAGNOSIS — I059 Rheumatic mitral valve disease, unspecified: Secondary | ICD-10-CM

## 2015-03-11 ENCOUNTER — Encounter: Payer: Self-pay | Admitting: Cardiology

## 2015-03-11 NOTE — Telephone Encounter (Signed)
Pt is returning Debra's call, in regards to some results from his Echo that was done on 8/18. Please call him back  Thanks

## 2015-03-11 NOTE — Telephone Encounter (Signed)
This encounter was created in error - please disregard.

## 2015-03-13 NOTE — Addendum Note (Signed)
Addended by: Zebedee Iba on: 03/13/2015 07:16 AM   Modules accepted: Orders

## 2015-03-18 NOTE — Addendum Note (Signed)
Addended by: Zebedee Iba on: 03/18/2015 02:15 PM   Modules accepted: Orders

## 2015-03-20 NOTE — Addendum Note (Signed)
Addended by: Diana Eves on: 03/20/2015 05:24 PM   Modules accepted: Orders

## 2016-09-23 ENCOUNTER — Ambulatory Visit (HOSPITAL_COMMUNITY)
Admission: RE | Admit: 2016-09-23 | Discharge: 2016-09-23 | Disposition: A | Discharge status: Home / Self Care | Payer: BLUE CROSS/BLUE SHIELD | Source: Ambulatory Visit | Attending: Urology | Admitting: Urology

## 2016-09-23 ENCOUNTER — Other Ambulatory Visit (HOSPITAL_COMMUNITY): Payer: Self-pay | Admitting: Urology

## 2016-09-23 DIAGNOSIS — C642 Malignant neoplasm of left kidney, except renal pelvis: Secondary | ICD-10-CM

## 2016-09-23 DIAGNOSIS — R918 Other nonspecific abnormal finding of lung field: Secondary | ICD-10-CM | POA: Insufficient documentation

## 2016-09-25 ENCOUNTER — Ambulatory Visit (HOSPITAL_COMMUNITY)
Admission: RE | Admit: 2016-09-25 | Discharge: 2016-09-25 | Disposition: A | Discharge status: Home / Self Care | Payer: BLUE CROSS/BLUE SHIELD | Source: Ambulatory Visit | Attending: Urology | Admitting: Urology

## 2016-09-25 ENCOUNTER — Other Ambulatory Visit (HOSPITAL_COMMUNITY): Payer: Self-pay | Admitting: Urology

## 2016-09-25 DIAGNOSIS — C642 Malignant neoplasm of left kidney, except renal pelvis: Secondary | ICD-10-CM | POA: Diagnosis not present

## 2018-02-27 NOTE — Progress Notes (Signed)
HPI The patient presents for followup of mitral valve repair.   It has been three years since he was last seen.  He has been having some trouble with his BP lately.  He had a stable MV repair on echo in 2016.  Since I last saw him he is done well.  He moved to a [employer] of here and has not been back in a while.  He is done very well.  He did recently have a low blood pressure although he did not really feel bad with this.  Because of this it was suggested that he had follow-up.  He is been quite active.  He walks routinely.  He does some work on his property. The patient denies any new symptoms such as chest discomfort, neck or arm discomfort. There has been no new shortness of breath, PND or orthopnea. There have been no reported palpitations, presyncope or syncope.   Allergies  Allergen Reactions  . Hydrocodone Nausea And Vomiting  . Oxycodone     Itching and nausea   . Penicillins Swelling    Current Outpatient Medications  Medication Sig Dispense Refill  . aspirin 81 MG tablet Take 81 mg by mouth daily.    Marland Kitchen atorvastatin (LIPITOR) 40 MG tablet TAKE 1 TABLET BY MOUTH EVERYDAY AT BEDTIME  5  . citalopram (CELEXA) 10 MG tablet Take 10 mg by mouth daily.     No current facility-administered medications for this visit.     Past Medical History:  Diagnosis Date  . Chronic kidney disease    Clear cell carcinoma  . Depression    Resolved off medications  . Dyslipidemia   . GERD (gastroesophageal reflux disease)   . IBS (irritable bowel syndrome)   . Migraines   . MVP (mitral valve prolapse)   . PONV (postoperative nausea and vomiting)     Past Surgical History:  Procedure Laterality Date  . ANAL FISSURE REPAIR     anal sphincterectomy  . INGUINAL HERNIA REPAIR  2010   Bilateral  . KNEE SURGERY  2008  . MITRAL VALVE REPAIR  2006  . SHOULDER SURGERY Right   . Sphyncterotomy  2004  . VASECTOMY     No family history on file.   Social History   Socioeconomic  History  . Marital status: Married    Spouse name: Not on file  . Number of children: 2  . Years of education: Not on file  . Highest education level: Not on file  Occupational History  . 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 Financial resource strain: Not on file  . Food insecurity:    Worry: Not on file    Inability: Not on file  . Transportation needs:    Medical: Not on file    Non-medical: Not on file  Tobacco Use  . Smoking status: Never Smoker  . Smokeless tobacco: Never Used  Substance and Sexual Activity  . Alcohol use: No  . Drug use: No  . Sexual activity: Not on file  Lifestyle  . Physical activity:    Days per week: Not on file    Minutes per session: Not on file  . Stress: Not on file  Relationships  . Social connections:    Talks on phone: Not on file    Gets together: Not on file    Attends religious service: Not on file  Active member of club or organization: Not on file    Attends meetings of clubs or organizations: Not on file    Relationship status: Not on file  . Intimate partner violence:    Fear of current or ex partner: Not on file    Emotionally abused: Not on file    Physically abused: Not on file    Forced sexual activity: Not on file  Other Topics Concern  . Not on file  Social History Narrative   The patient is married and lives with his [place] in [place].    ROS:  As stated in the HPI and negative for all other systems.   PHYSICAL EXAM BP 106/68   Pulse 68   Ht 5\' 7"  (1.702 m)   Wt 180 lb 9.6 oz (81.9 kg)   BMI 28.29 kg/m   GENERAL:  Well appearing NECK:  No jugular venous distention, waveform within normal limits, carotid upstroke brisk and symmetric, no bruits, no thyromegaly LUNGS:  Clear to auscultation bilaterally CHEST:  Well healed surgical scar. HEART:  PMI not displaced or sustained,S1 and S2 within normal limits, no S3,  no S4, no clicks, no rubs, no murmurs ABD:  Flat, positive bowel sounds normal in frequency in pitch, no bruits, no rebound, no guarding, no midline pulsatile mass, no hepatomegaly, no splenomegaly EXT:  2 plus pulses throughout, no edema, no cyanosis no clubbing   EKG:  Sinus rhythm, rate 68, axis within normal limits, intervals within normal limits, no acute ST-T wave changes.  02/28/2018   ASSESSMENT AND PLAN  Mitral Valve Repair - He needs a follow up echo. Further management will be based on these results.   Cardiomyopathy - EF was low normal.  He will have a follow up echo as above.   Hypotension - He has no symptoms related to this.  He is advised to hydrate.  No change in therapy.

## 2018-02-28 ENCOUNTER — Encounter: Payer: Self-pay | Admitting: Cardiology

## 2018-02-28 ENCOUNTER — Ambulatory Visit (INDEPENDENT_AMBULATORY_CARE_PROVIDER_SITE_OTHER): Payer: BLUE CROSS/BLUE SHIELD | Admitting: Cardiology

## 2018-02-28 VITALS — BP 106/68 | HR 68 | Ht 67.0 in | Wt 180.6 lb

## 2018-02-28 DIAGNOSIS — Z9889 Other specified postprocedural states: Secondary | ICD-10-CM | POA: Diagnosis not present

## 2018-02-28 DIAGNOSIS — I959 Hypotension, unspecified: Secondary | ICD-10-CM

## 2018-02-28 NOTE — Patient Instructions (Signed)
Medication Instructions:  Continue current medications  If you need a refill on your cardiac medications before your next appointment, please call your pharmacy.  Labwork: None Ordered   Testing/Procedures: Your physician has requested that you have an echocardiogram. Echocardiography is a painless test that uses sound waves to create images of your heart. It provides your doctor with information about the size and shape of your heart and how well your heart's chambers and valves are working. This procedure takes approximately one hour. There are no restrictions for this procedure.   Follow-Up: Your physician wants you to follow-up in: 18 Months. You should receive a reminder letter in the mail two months in advance. If you do not receive a letter, please call our office [phone].      Thank you for choosing [facility] at [facility]!!

## 2018-03-11 ENCOUNTER — Other Ambulatory Visit: Payer: Self-pay

## 2018-03-11 ENCOUNTER — Ambulatory Visit (HOSPITAL_COMMUNITY): Payer: BLUE CROSS/BLUE SHIELD | Attending: Cardiology

## 2018-03-11 DIAGNOSIS — Z9889 Other specified postprocedural states: Secondary | ICD-10-CM | POA: Diagnosis present

## 2018-03-11 DIAGNOSIS — I083 Combined rheumatic disorders of mitral, aortic and tricuspid valves: Secondary | ICD-10-CM | POA: Insufficient documentation

## 2019-08-15 DIAGNOSIS — I429 Cardiomyopathy, unspecified: Secondary | ICD-10-CM | POA: Insufficient documentation

## 2019-08-15 DIAGNOSIS — Z9889 Other specified postprocedural states: Secondary | ICD-10-CM | POA: Insufficient documentation

## 2019-08-15 DIAGNOSIS — Z7189 Other specified counseling: Secondary | ICD-10-CM | POA: Insufficient documentation

## 2019-08-15 DIAGNOSIS — I959 Hypotension, unspecified: Secondary | ICD-10-CM | POA: Insufficient documentation

## 2019-08-15 NOTE — Progress Notes (Signed)
Cardiology Office Note   Date:  08/16/2019   ID:  Craig Duke, DOB Sep 28, 1955, MRN [id number]  PCP:  Bartolo Darter, MD  Cardiologist:   No primary care provider on file.   Chief Complaint  Patient presents with  . MVR      History of Present Illness: Craig Duke is a 64 y.o. male who presents for followup of mitral valve repair.  Since I last saw him he has done well.  The patient denies any new symptoms such as chest discomfort, neck or arm discomfort. There has been no new shortness of breath, PND or orthopnea. There have been no reported palpitations, presyncope or syncope.  He cuts firewood.  He goes for walks.  He lives in [place] and actually is planning to move all of his healthcare with [facility].      Past Medical History:  Diagnosis Date  . Chronic kidney disease    Clear cell carcinoma  . Depression    Resolved off medications  . Dyslipidemia   . GERD (gastroesophageal reflux disease)   . IBS (irritable bowel syndrome)   . Migraines   . MVP (mitral valve prolapse)   . PONV (postoperative nausea and vomiting)     Past Surgical History:  Procedure Laterality Date  . ANAL FISSURE REPAIR     anal sphincterectomy  . INGUINAL HERNIA REPAIR  2010   Bilateral  . KNEE SURGERY  2008  . MITRAL VALVE REPAIR  2006  . SHOULDER SURGERY Right   . Sphyncterotomy  2004  . VASECTOMY       Current Outpatient Medications  Medication Sig Dispense Refill  . aspirin 81 MG tablet Take 81 mg by mouth daily.    Marland Kitchen atorvastatin (LIPITOR) 40 MG tablet TAKE 1 TABLET BY MOUTH EVERYDAY AT BEDTIME  5  . citalopram (CELEXA) 10 MG tablet Take 10 mg by mouth daily.    . cyclobenzaprine (FLEXERIL) 10 MG tablet Take 10 mg by mouth at bedtime.     No current facility-administered medications for this visit.    Allergies:   Hydrocodone, Oxycodone, and Penicillins    ROS:  Please see the history of present illness.   Otherwise, review of systems are positive for none.   All other  systems are reviewed and negative.    PHYSICAL EXAM: VS:  BP 115/73   Pulse 91   Temp 97.7 F (36.5 C)   Ht 5\' 7"  (1.702 m)   Wt 188 lb 9.6 oz (85.5 kg)   SpO2 98%   BMI 29.54 kg/m  , BMI Body mass index is 29.54 kg/m. GENERAL:  Well appearing NECK:  No jugular venous distention, waveform within normal limits, carotid upstroke brisk and symmetric, no bruits, no thyromegaly LUNGS:  Clear to auscultation bilaterally CHEST:  Well healed surgical scar. HEART:  PMI not displaced or sustained,S1 and S2 within normal limits, no S3, no S4, no clicks, no rubs, no murmurs ABD:  Flat, positive bowel sounds normal in frequency in pitch, no bruits, no rebound, no guarding, no midline pulsatile mass, no hepatomegaly, no splenomegaly EXT:  2 plus pulses throughout, no edema, no cyanosis no clubbing    EKG:  EKG is ordered today. The ekg ordered today demonstrates sinus rhythm, RSR prime V1 and V2, premature ventricular contractions, no acute ST-T wave changes.   Recent Labs: No results found for requested labs within last 8760 hours.    Lipid Panel No results found for: CHOL, TRIG, HDL, CHOLHDL,  VLDL, LDLCALC, LDLDIRECT    Wt Readings from Last 3 Encounters:  08/16/19 188 lb 9.6 oz (85.5 kg)  02/28/18 180 lb 9.6 oz (81.9 kg)  04/12/14 178 lb 3.2 oz (80.8 kg)      Other studies Reviewed: Additional studies/ records that were reviewed today include: None. Review of the above records demonstrates:  NA   ASSESSMENT AND PLAN:  Mitral Valve Repair - I suggested a follow-up echo but now that we know he is moving his care to be closer to his home in [place] I suggested that he have this done later this year after he is established with a new cardiologist.  I be happy to help him to get a cardiologist regarding can make a personal records.   Current medicines are reviewed at length with the patient today.  The patient does not have concerns regarding medicines.  The following changes  have been made:  no change  Labs/ tests ordered today include: None  Orders Placed This Encounter  Procedures  . EKG 12-Lead     Disposition:   FU with me if he moves back to [place].      Signed, Minus Breeding, MD  08/16/2019 2:47 PM    [place]

## 2019-08-16 ENCOUNTER — Ambulatory Visit (INDEPENDENT_AMBULATORY_CARE_PROVIDER_SITE_OTHER): Payer: Medicare Other | Admitting: Cardiology

## 2019-08-16 ENCOUNTER — Encounter: Payer: Self-pay | Admitting: Cardiology

## 2019-08-16 ENCOUNTER — Other Ambulatory Visit: Payer: Self-pay

## 2019-08-16 VITALS — BP 115/73 | HR 91 | Temp 97.7°F | Ht 67.0 in | Wt 188.6 lb

## 2019-08-16 DIAGNOSIS — I959 Hypotension, unspecified: Secondary | ICD-10-CM | POA: Diagnosis not present

## 2019-08-16 DIAGNOSIS — I429 Cardiomyopathy, unspecified: Secondary | ICD-10-CM | POA: Diagnosis not present

## 2019-08-16 DIAGNOSIS — Z9889 Other specified postprocedural states: Secondary | ICD-10-CM | POA: Diagnosis not present

## 2019-08-16 DIAGNOSIS — Z7189 Other specified counseling: Secondary | ICD-10-CM

## 2019-08-16 NOTE — Patient Instructions (Signed)
Medication Instructions:  No Changes *If you need a refill on your cardiac medications before your next appointment, please call your pharmacy*  Lab Work: None  Testing/Procedures: None  Follow-Up: At [facility], you and your health needs are our priority.  As part of our continuing mission to provide you with exceptional heart care, we have created designated Provider Care Teams.  These Care Teams include your primary Cardiologist (physician) and Advanced Practice Providers (APPs -  Physician Assistants and Nurse Practitioners) who all work together to provide you with the care you need, when you need it.
